# Patient Record
Sex: Male | Born: 1975
Health system: Southern US, Community
[De-identification: ages and names within clinical notes are randomized; demographics above are authoritative.]

## PROBLEM LIST (undated history)

## (undated) DIAGNOSIS — M109 Gout, unspecified: Secondary | ICD-10-CM

## (undated) DIAGNOSIS — F419 Anxiety disorder, unspecified: Principal | ICD-10-CM

## (undated) DIAGNOSIS — F329 Major depressive disorder, single episode, unspecified: Secondary | ICD-10-CM

## (undated) HISTORY — PX: THROAT SURGERY: SHX803

## (undated) HISTORY — PX: CHOLECYSTECTOMY: SHX55

## (undated) HISTORY — DX: Major depressive disorder, single episode, unspecified: F32.9

## (undated) HISTORY — DX: Gout, unspecified: M10.9

## (undated) HISTORY — DX: Anxiety disorder, unspecified: F41.9

## (undated) HISTORY — PX: APPENDECTOMY: SHX54

---

## 2009-05-13 ENCOUNTER — Ambulatory Visit: Payer: Self-pay | Admitting: Internal Medicine

## 2009-05-13 DIAGNOSIS — F329 Major depressive disorder, single episode, unspecified: Secondary | ICD-10-CM

## 2009-05-13 DIAGNOSIS — E669 Obesity, unspecified: Secondary | ICD-10-CM | POA: Insufficient documentation

## 2009-05-13 DIAGNOSIS — M109 Gout, unspecified: Secondary | ICD-10-CM | POA: Insufficient documentation

## 2009-05-13 DIAGNOSIS — F419 Anxiety disorder, unspecified: Secondary | ICD-10-CM

## 2009-05-13 DIAGNOSIS — F32A Depression, unspecified: Secondary | ICD-10-CM | POA: Insufficient documentation

## 2009-05-13 HISTORY — DX: Anxiety disorder, unspecified: F41.9

## 2009-05-13 HISTORY — DX: Gout, unspecified: M10.9

## 2009-05-13 HISTORY — DX: Depression, unspecified: F32.A

## 2009-05-16 LAB — CONVERTED CEMR LAB
ALT: 41 units/L (ref 0–53)
BUN: 11 mg/dL (ref 6–23)
CO2: 28 meq/L (ref 19–32)
Chloride: 108 meq/L (ref 96–112)
Creatinine, Ser: 1 mg/dL (ref 0.4–1.5)
Glucose, Bld: 101 mg/dL — ABNORMAL HIGH (ref 70–99)

## 2010-05-13 NOTE — Assessment & Plan Note (Signed)
Summary: NEW TO EST,GOUT,UHC INS,NS/RH......Tyler Doyle   Vital Signs:  Patient profile:   35 year old male Height:      69.5 inches Weight:      292.13 pounds BMI:     42.68 Pulse rate:   60 / minute BP sitting:   100 / 60  Vitals Entered By: Kandice Hams (May 13, 2009 1:06 PM) CC: new pt discuss gout   History of Present Illness: needs a PCP  started healthier diet and exercising more in the last 4 weeks.  Has lost several pounds  he has a long history of gout, usually have episodes every 3 months.  His last episode started before his diet,4 weeks ago, usually his pain and swelling in one of his feet.   Preventive Screening-Counseling & Management  Alcohol-Tobacco     Alcohol drinks/day: 0     Smoking Status: never  Caffeine-Diet-Exercise     Caffeine use/day: coffee 1 cup a day, soda coke  Safety-Violence-Falls     Seat Belt Use: yes     Helmet Use: no     Firearms in the Home: firearms in the home     Smoke Detectors: yes     Violence in the Home: not applicable  Current Medications (verified): 1)  Prozac 20 Mg Caps (Fluoxetine Hcl) .Tyler Doyle.. 1 By Mouth Qd 2)  Allopurinol 300 Mg Tabs (Allopurinol)  Allergies (verified): No Known Drug Allergies  Past History:  Past Medical History: Depression, f/u  at  [facility]/  [facility] gout obesity   Past Surgical History: Appendectomy 1990 vocal cords 1999 Cholecystectomy (1997)  Family History: DM-- F M  MI-- F age 75 colon ca-- no prostate ca--no  Social History: Occupation Armed forces training and education officer Married 2 children  from [place] tobacco--no ETOH-- no Smoking Status:  never Caffeine use/day:  coffee 1 cup a day, soda coke Seat Belt Use:  yes Occupation:  employed  Review of Systems       denies chest pain or road extremity edema was slightly short of breath prior to his diet, now feels much better with exercise and weight loss  Physical Exam  General:  alert, well-developed, and  overweight-appearing.   Neck:  no masses and no thyromegaly.   Lungs:  normal respiratory effort, no intercostal retractions, no accessory muscle use, and normal breath sounds.   Heart:  normal rate, regular rhythm, and no murmur.   Extremities:  no pretibial edema bilaterally  no evidence of gout today Psych:  Oriented X3, memory intact for recent and remote, normally interactive, good eye contact, not anxious appearing, and not depressed appearing.     Impression & Recommendations:  Problem # 1:  GOUT (ICD-274.9) labs he is taking allopurinol, continue with that for now Indocin as needed for pain printed material provided regards diet  I anticipate that his diet and weight loss will help his gout  tremendously, hopefully at some point we will be able to stop allopurinol His updated medication list for this problem includes:    Allopurinol 300 Mg Tabs (Allopurinol)    Indomethacin 50 Mg Caps (Indomethacin) ..... One by mouth every 6 hours as needed for gout  Orders: Venipuncture (16109) TLB-Uric Acid, Blood (84550-URIC) TLB-ALT (SGPT) (84460-ALT) TLB-AST (SGOT) (84450-SGOT) TLB-BMP (Basic Metabolic Panel-BMET) (80048-METABOL)  Problem # 2:  OBESITY (ICD-278.00) praised  for his efforts to lose weight  Complete Medication List: 1)  Prozac 20 Mg Caps (Fluoxetine hcl) .Tyler Doyle.. 1 by mouth qd 2)  Allopurinol 300 Mg  Tabs (Allopurinol) 3)  Indomethacin 50 Mg Caps (Indomethacin) .... One by mouth every 6 hours as needed for gout  Patient Instructions: 1)  Please schedule a follow-up appointment in 2 months, fasting, physical exam Prescriptions: ALLOPURINOL 300 MG TABS (ALLOPURINOL)   #30 x 3   Entered and Authorized by:   Nolon Rod. Anetria Harwick MD   Signed by:   Nolon Rod. Najir Roop MD on 05/13/2009   Method used:   Print then Give to Patient   RxID:   [id number] INDOMETHACIN 50 MG CAPS (INDOMETHACIN) one by mouth every 6 hours as needed for gout  #60 x 0   Entered and Authorized by:   Nolon Rod.  Lorren Splawn MD   Signed by:   Nolon Rod. Jonesha Tsuchiya MD on 05/13/2009   Method used:   Print then Give to Patient   RxID:   [id number]

## 2011-06-19 ENCOUNTER — Ambulatory Visit (INDEPENDENT_AMBULATORY_CARE_PROVIDER_SITE_OTHER): Payer: 59 | Admitting: Family Medicine

## 2011-06-19 ENCOUNTER — Encounter: Payer: Self-pay | Admitting: Family Medicine

## 2011-06-19 VITALS — BP 112/78 | HR 72 | Temp 99.0°F | Wt 240.0 lb

## 2011-06-19 DIAGNOSIS — R141 Gas pain: Secondary | ICD-10-CM | POA: Insufficient documentation

## 2011-06-19 DIAGNOSIS — R143 Flatulence: Secondary | ICD-10-CM

## 2011-06-19 NOTE — Patient Instructions (Signed)
Your abdominal pain is most likely gas and bloating Start Beano due to your high fiber diet Drink plenty of water Call with any questions or concerns Hang in there!!!

## 2011-06-19 NOTE — Progress Notes (Signed)
  Subjective:    Patient ID: Keelen Quevedo, male    DOB: 04-Apr-1976, 36 y.o.   MRN: [id number]  HPI abd pain- started new diet 3 weeks ago that is predominantly fruits and veggies.  Started supplement that expands in gut- Glucomannan.  Developed abdominal pressure and bloating, primarily on R side.  Stopped supplement 1 week ago and sxs have improved.  Will still have some pressure after eating.  Has not noticed whether sxs improve w/ BMs.  No N/V.  No constipation or diarrhea.  No fevers.   Review of Systems For ROS see HPI     Objective:   Physical Exam  Constitutional: He is oriented to person, place, and time. He appears well-developed and well-nourished. No distress.  HENT:  Head: Normocephalic and atraumatic.  Eyes: Conjunctivae and EOM are normal. Pupils are equal, round, and reactive to light.  Neck: Normal range of motion. Neck supple. No thyromegaly present.  Cardiovascular: Normal rate, regular rhythm, normal heart sounds and intact distal pulses.   No murmur heard. Pulmonary/Chest: Effort normal and breath sounds normal. No respiratory distress.  Abdominal: Soft. Bowel sounds are normal. He exhibits no distension and no mass. There is no tenderness. There is no rebound and no guarding.  Musculoskeletal: He exhibits no edema.  Lymphadenopathy:    He has no cervical adenopathy.  Neurological: He is alert and oriented to person, place, and time. No cranial nerve deficit.  Skin: Skin is warm and dry.  Psychiatric: He has a normal mood and affect. His behavior is normal.          Assessment & Plan:

## 2011-06-21 NOTE — Assessment & Plan Note (Signed)
New.  Pt's pain is most likely due to gas caused by new supplement and high fiber diet.  No abnormalities on PE.  Reviewed supportive care and red flags that should prompt return.  Pt expressed understanding and is in agreement w/ plan.

## 2014-02-23 ENCOUNTER — Ambulatory Visit (INDEPENDENT_AMBULATORY_CARE_PROVIDER_SITE_OTHER): Payer: 59 | Admitting: Internal Medicine

## 2014-02-23 ENCOUNTER — Encounter: Payer: Self-pay | Admitting: Internal Medicine

## 2014-02-23 VITALS — BP 119/77 | HR 80 | Temp 98.3°F | Wt 260.0 lb

## 2014-02-23 DIAGNOSIS — S8392XA Sprain of unspecified site of left knee, initial encounter: Secondary | ICD-10-CM

## 2014-02-23 NOTE — Progress Notes (Signed)
Pre visit review using our clinic review tool, if applicable. No additional management support is needed unless otherwise documented below in the visit note. 

## 2014-02-23 NOTE — Progress Notes (Signed)
   Subjective:    Patient ID: Tyler Doyle, male    DOB: 1976-03-14, 38 y.o.   MRN: [id number]  DOS:  02/23/2014 Type of visit - description : acute Interval history: 4-5 days ago exercised, first time he did  in a while. The next day he started with pain at the left knee. No swelling. The pain is on and off, only moderate today. Has not taken any medication for it.   ROS Denies injury or fall  the knee doesn't give out or locks.  No fever chills  History reviewed. No pertinent past medical history.  Past Surgical History  Procedure Laterality Date  . Appendectomy    . Cholecystectomy      History   Social History  . Marital Status: Married    Spouse Name: N/A    Number of Children: 2  . Years of Education: N/A   Occupational History  . musician    Social History Main Topics  . Smoking status: Never Smoker   . Smokeless tobacco: Never Used  . Alcohol Use: No  . Drug Use: No  . Sexual Activity: Not on file   Other Topics Concern  . Not on file   Social History Narrative   Separated from wife        Medication List    Notice  As of 02/23/2014 11:59 PM   You have not been prescribed any medications.         Objective:   Physical Exam  Musculoskeletal:       Legs:  BP 119/77 mmHg  Pulse 80  Temp(Src) 98.3 F (36.8 C) (Oral)  Wt 260 lb (117.935 kg)  SpO2 97% General -- alert, well-developed, NAD.    Extremities-- no pretibial edema bilaterally  R knee  normal Left knee:  no red, swollen or warm. Slightly TTP  Externally, see graphic. Range of motion is normal. Joint is  stable. Neurologic--  alert & oriented X3. Speech normal, gait appropriate for age, strength symmetric and appropriate for age.  Psych-- Cognition and judgment appear intact. Cooperative with normal attention span and concentration. No anxious or depressed appearing.        Assessment & Plan:    Knee sprain, Recommend conservative treatment, see instructions. If not  better consider sports medicine referral

## 2014-02-23 NOTE — Patient Instructions (Signed)
Apply ice to the knee 2 times a day for the next week  IBUPROFEN (Advil or Motrin) 200 mg 2 tablets every 6 hours as needed for pain.  Always take it with food because may cause gastritis and ulcers.  If you notice nausea, stomach pain, change in the color of stools --->  Stop the medicine and let us know  Use a knee sleeve as needed  Go back to exercise once  better, remember to start a gradual exercise program, always a stretching before exercise  Call if not improving in the next 2 weeks

## 2014-04-24 ENCOUNTER — Emergency Department (HOSPITAL_COMMUNITY): Payer: 59

## 2014-04-24 ENCOUNTER — Emergency Department (HOSPITAL_COMMUNITY)
Admission: EM | Admit: 2014-04-24 | Discharge: 2014-04-24 | Disposition: A | Discharge status: Home / Self Care | Payer: 59 | Attending: Emergency Medicine | Admitting: Emergency Medicine

## 2014-04-24 ENCOUNTER — Encounter (HOSPITAL_COMMUNITY): Payer: Self-pay

## 2014-04-24 ENCOUNTER — Other Ambulatory Visit: Payer: Self-pay

## 2014-04-24 ENCOUNTER — Ambulatory Visit: Payer: Self-pay | Admitting: Medical

## 2014-04-24 ENCOUNTER — Telehealth: Payer: Self-pay | Admitting: Internal Medicine

## 2014-04-24 DIAGNOSIS — R002 Palpitations: Secondary | ICD-10-CM

## 2014-04-24 DIAGNOSIS — R0602 Shortness of breath: Secondary | ICD-10-CM | POA: Insufficient documentation

## 2014-04-24 DIAGNOSIS — F419 Anxiety disorder, unspecified: Secondary | ICD-10-CM

## 2014-04-24 LAB — BASIC METABOLIC PANEL
ANION GAP: 11 (ref 5–15)
BUN: 16 mg/dL (ref 6–23)
CO2: 21 mmol/L (ref 19–32)
Calcium: 10.1 mg/dL (ref 8.4–10.5)
Chloride: 109 mEq/L (ref 96–112)
Creatinine, Ser: 0.96 mg/dL (ref 0.50–1.35)
GFR calc Af Amer: >90 mL/min (ref >90)
Glucose, Bld: 93 mg/dL (ref 70–99)
Potassium: 3.9 mmol/L (ref 3.5–5.1)
SODIUM: 141 mmol/L (ref 135–145)

## 2014-04-24 LAB — CBC
HCT: 41.7 % (ref 39.0–52.0)
Hemoglobin: 14.6 g/dL (ref 13.0–17.0)
MCH: 31.3 pg (ref 26.0–34.0)
MCHC: 35 g/dL (ref 30.0–36.0)
MCV: 89.5 fL (ref 78.0–100.0)
PLATELETS: 159 10*3/uL (ref 150–400)
RBC: 4.66 MIL/uL (ref 4.22–5.81)
RDW: 13.3 % (ref 11.5–15.5)
WBC: 5 10*3/uL (ref 4.0–10.5)

## 2014-04-24 LAB — I-STAT TROPONIN, ED: Troponin i, poc: 0 ng/mL (ref 0.00–0.08)

## 2014-04-24 MED ORDER — FLUOXETINE HCL 20 MG PO TABS: 20.0000 mg | ORAL_TABLET | Freq: Every day | ORAL | Status: DC

## 2014-04-24 NOTE — ED Notes (Signed)
Pt states he has been having sob and heart palpitations for the past 4 nights and when he lays down at night the palpitations get worse. Has been taking ibuprofen. No nausea or vomiting.

## 2014-04-24 NOTE — Telephone Encounter (Signed)
Pt called back [facility] RN advised pt to go to Urgent Care

## 2014-04-24 NOTE — Telephone Encounter (Signed)
Noted  

## 2014-04-24 NOTE — Telephone Encounter (Signed)
Pt was transfer to [facility] due to shortness of breath, rapid heartbeat. Scheduled pt to see PA today at 9:15am.

## 2014-04-24 NOTE — ED Provider Notes (Signed)
CSN: [id number]     Arrival date & time 04/24/14  4098 History   First MD Initiated Contact with Patient 04/24/14 0945     Chief Complaint  Patient presents with  . Shortness of Breath  . Palpitations     (Consider location/radiation/quality/duration/timing/severity/associated sxs/prior Treatment) Patient is a 39 y.o. male presenting with shortness of breath. The history is provided by the patient.  Shortness of Breath Severity:  Moderate Onset quality:  Sudden Duration:  4 days Timing:  Intermittent Progression:  Waxing and waning Context comment:  Only occurs at night when going to bed Relieved by:  Nothing Exacerbated by: night time. Ineffective treatments:  None tried Associated symptoms: no abdominal pain, no chest pain, no cough, no diaphoresis, no fever, no headaches, no rash, no sore throat, no vomiting and no wheezing   Risk factors: no family hx of DVT, no hx of PE/DVT, no prolonged immobilization and no recent surgery     History reviewed. No pertinent past medical history. Past Surgical History  Procedure Laterality Date  . Appendectomy    . Cholecystectomy     No family history on file. History  Substance Use Topics  . Smoking status: Never Smoker   . Smokeless tobacco: Never Used  . Alcohol Use: No    Review of Systems  Constitutional: Negative for fever, diaphoresis, activity change and appetite change.  HENT: Negative for facial swelling, sore throat, tinnitus, trouble swallowing and voice change.   Eyes: Negative for pain, redness and visual disturbance.  Respiratory: Positive for shortness of breath. Negative for cough, chest tightness and wheezing.   Cardiovascular: Positive for palpitations. Negative for chest pain and leg swelling.  Gastrointestinal: Negative for nausea, vomiting, abdominal pain, diarrhea, constipation and abdominal distention.  Endocrine: Negative.   Genitourinary: Negative.  Negative for dysuria, decreased urine volume, scrotal  swelling and testicular pain.  Musculoskeletal: Negative for myalgias, back pain and gait problem.  Skin: Negative.  Negative for rash.  Neurological: Negative.  Negative for dizziness, tremors, weakness and headaches.  Psychiatric/Behavioral: Negative for suicidal ideas, hallucinations and self-injury. The patient is nervous/anxious.       Allergies  Sulfa antibiotics  Home Medications   Prior to Admission medications   Medication Sig Start Date End Date Taking? Authorizing Provider  FLUoxetine (PROZAC) 20 MG tablet Take 1 tablet (20 mg total) by mouth daily. 04/24/14   Lula Olszewski, MD   BP 123/70 mmHg  Pulse 66  Temp(Src) 98.1 F (36.7 C) (Oral)  Resp 14  Ht  (1.778 m)  Wt 270 lb (122.471 kg)  BMI 38.74 kg/m2  SpO2 97% Physical Exam  Constitutional: He is oriented to person, place, and time. He appears well-developed and well-nourished. No distress.  HENT:  Head: Normocephalic and atraumatic.  Right Ear: External ear normal.  Left Ear: External ear normal.  Nose: Nose normal.  Mouth/Throat: Uvula is midline and oropharynx is clear and moist. No trismus in the jaw. No uvula swelling. No oropharyngeal exudate, posterior oropharyngeal edema, posterior oropharyngeal erythema or tonsillar abscesses.  No fluctuance of floor of mouth or deep space abscesses  Eyes: Conjunctivae and EOM are normal. Pupils are equal, round, and reactive to light. No scleral icterus.  Neck: Normal range of motion. Neck supple. No JVD present. No tracheal deviation present. No thyromegaly present.  Cardiovascular: Normal rate and intact distal pulses.  Exam reveals no gallop and no friction rub.   No murmur heard. Pulmonary/Chest: Effort normal and breath sounds normal. No stridor.  No respiratory distress. He has no wheezes. He has no rales.  Abdominal: Soft. He exhibits no distension. There is no tenderness. There is no rebound and no guarding.  Musculoskeletal: Normal range of motion. He  exhibits no edema or tenderness.  Neurological: He is alert and oriented to person, place, and time. No cranial nerve deficit. He exhibits normal muscle tone. Coordination normal.  5/5 strength in all 4 extremities. Normal Gait.   Skin: Skin is warm and dry. No rash noted. He is not diaphoretic.  Psychiatric: He has a normal mood and affect. His behavior is normal. He expresses no homicidal and no suicidal ideation. He expresses no suicidal plans and no homicidal plans.  Nursing note and vitals reviewed.   ED Course  Procedures (including critical care time) Labs Review Labs Reviewed  CBC  BASIC METABOLIC PANEL  Rosezena SensorI-STAT TROPOININ, ED    Imaging Review Dg Chest 2 View  04/24/2014   CLINICAL DATA:  Shortness of breath, heart palpitations for the past 4 nights.  EXAM: CHEST  2 VIEW  COMPARISON:  None.  FINDINGS: The heart size and mediastinal contours are within normal limits. Both lungs are clear. The visualized skeletal structures are unremarkable.  IMPRESSION: No active cardiopulmonary disease.   Electronically Signed   By: Charlett NoseKevin  Dover M.D.   On: 04/24/2014 10:31     EKG Interpretation   Date/Time:  Tuesday April 24 2014 09:37:45 EST Ventricular Rate:  77 PR Interval:  142 QRS Duration: 90 QT Interval:  372 QTC Calculation: 420 R Axis:   50 Text Interpretation:  Normal sinus rhythm Normal ECG No old tracing to  compare Confirmed by MILLER  MD, BRIAN ([id number]) on 04/24/2014 9:47:10 AM      MDM   Final diagnoses:  Palpitations  Anxiety    The patient is a 39 year old male history of anxiety and depression presents with 4 days of intermittent shortness of breath and palpitations that only occur at nighttime. Patient reports that he is asymptomatic throughout the day. Patient does endorse anxiety with the symptoms. Of note patient recently stopped taking Prozac 6 months ago due to sexual side effects. EKG shows sinus rhythm with no ischemic changes. Laboratory tests including  troponin are unremarkable. Chest x-ray shows no acute cardiopulmonary pathology. I do not clinically suspect PE due to intermittent nature of symptoms and patient is PERC negative so will not pursue further testing. Do not suspect ACS, athymia, or other acute pathology due to patient's history exam and workup. I have discussed the risks and benefits of restarting Prozac at this time and the patient states that he would wish to restart the medication knowing that he may experience side effects. I feel patient is appropriate for discharge with primary care follow-up. Patient voices understanding and agreement with this plan and is discharged with standard emergency department return precautions.  Patient seen with attending, Dr. Hyacinth MeekerMiller, who oversaw clinical decision making.   Lula OlszewskiMike Ketara Cavness, MD 04/24/14 1120  Vida RollerBrian D Miller, MD 04/24/14 1126

## 2014-04-26 ENCOUNTER — Encounter: Payer: Self-pay | Admitting: Internal Medicine

## 2014-04-26 ENCOUNTER — Ambulatory Visit (INDEPENDENT_AMBULATORY_CARE_PROVIDER_SITE_OTHER): Payer: 59 | Admitting: Internal Medicine

## 2014-04-26 VITALS — BP 132/78 | HR 85 | Temp 98.3°F | Wt 267.5 lb

## 2014-04-26 DIAGNOSIS — F419 Anxiety disorder, unspecified: Principal | ICD-10-CM

## 2014-04-26 DIAGNOSIS — F329 Major depressive disorder, single episode, unspecified: Secondary | ICD-10-CM

## 2014-04-26 DIAGNOSIS — F32A Depression, unspecified: Secondary | ICD-10-CM

## 2014-04-26 DIAGNOSIS — N529 Male erectile dysfunction, unspecified: Secondary | ICD-10-CM | POA: Insufficient documentation

## 2014-04-26 DIAGNOSIS — N522 Drug-induced erectile dysfunction: Secondary | ICD-10-CM

## 2014-04-26 DIAGNOSIS — F418 Other specified anxiety disorders: Secondary | ICD-10-CM

## 2014-04-26 DIAGNOSIS — R002 Palpitations: Secondary | ICD-10-CM

## 2014-04-26 LAB — TSH: TSH: 2.2 u[IU]/mL (ref 0.35–4.50)

## 2014-04-26 MED ORDER — SILDENAFIL CITRATE 100 MG PO TABS: 50.0000 mg | ORAL_TABLET | Freq: Every day | ORAL | Status: DC | PRN

## 2014-04-26 NOTE — Patient Instructions (Signed)
Get your blood work before you leave     Please come back to the office in 6 months  for a physical exam. Come back fasting   

## 2014-04-26 NOTE — Assessment & Plan Note (Signed)
Seems to be a side effect from Prozac, see comments under anxiety. Prescribed Viagra, how to use it discussed

## 2014-04-26 NOTE — Progress Notes (Signed)
Pre visit review using our clinic review tool, if applicable. No additional management support is needed unless otherwise documented below in the visit note. 

## 2014-04-26 NOTE — Progress Notes (Signed)
   Subjective:    Patient ID: Tyler Doyle, male    DOB: 02-Jul-1975, 39 y.o.   MRN: [id number]  DOS:  04/26/2014 Type of visit - description : ER follow-up Interval history: Tyler FlesherWent to the ER 04/24/2014 after he developed shortness or breath when lying down, palpitations described as his heart going fast. Symptoms have been in the context of the patient quitting SSRI 6 months ago. At the ER EKG, CBC, BMP and chest x-ray where normal or negative. The patient felt symptoms were related to anxiety because he was quite anxious at the time of the palpitations. He was released home and recommended to restart fluoxetine which he did. He is asymptomatic since. As far as the anxiety, he has been on SSRIs for years, see assessment and plan.    ROS Has been taking ibuprofen consistently for the last few weeks due to dental pain. Denies nausea, vomiting, blood in the stools or abdominal pain. Occasionally feels bloated. He has some heartburn.  Past Medical History  Diagnosis Date  . Anxiety and depression 05/13/2009    Qualifier: Diagnosis of  By: Drue NovelPaz MD, Tyler RodJose E.   . Gout 05/13/2009    Qualifier: Diagnosis of  By: Drue NovelPaz MD, Tyler RodJose E.     Past Surgical History  Procedure Laterality Date  . Appendectomy    . Cholecystectomy    . Throat surgery      vocal cord polyp    History   Social History  . Marital Status: Married    Spouse Name: N/A    Number of Children: 2  . Years of Education: N/A   Occupational History  . musician    Social History Main Topics  . Smoking status: Never Smoker   . Smokeless tobacco: Never Used  . Alcohol Use: No  . Drug Use: No  . Sexual Activity: Not on file   Other Topics Concern  . Not on file   Social History Narrative   Separated from wife   [place]        Medication List       This list is accurate as of: 04/26/14 11:59 PM.  Always use your most recent med list.               FLUoxetine 20 MG tablet  Commonly known as:   PROZAC  Take 1 tablet (20 mg total) by mouth daily.     sildenafil 100 MG tablet  Commonly known as:  VIAGRA  Take 0.5-1 tablets (50-100 mg total) by mouth daily as needed for erectile dysfunction.           Objective:   Physical Exam BP 132/78 mmHg  Pulse 85  Temp(Src) 98.3 F (36.8 C) (Oral)  Wt 267 lb 8 oz (121.337 kg)  SpO2 93%  General -- alert, well-developed, NAD.  Neck --no thyromegaly   HEENT-- Not pale.   Lungs -- normal respiratory effort, no intercostal retractions, no accessory muscle use, and normal breath sounds.  Heart-- normal rate, regular rhythm, no murmur.   Extremities-- no pretibial edema bilaterally  Neurologic--  alert & oriented X3. Speech normal, gait appropriate for age, strength symmetric and appropriate for age.   Psych-- Cognition and judgment appear intact. Cooperative with normal attention span and concentration. No anxious or depressed appearing.       Assessment & Plan:   palpitations, believed to be related to anxiety, for completeness we'll check a TSH, patient to call if symptoms resurface

## 2014-04-26 NOTE — Assessment & Plan Note (Addendum)
Long history of anxiety and depression, sees Dr. Tomasa Randunningham, on Prozac for 3 or 4 years; due to  erectile dysfunction ( a s/e from meds) he decided to discontinue Prozac. Become very anxious couple days ago and ended up in the ER. Recommend to see  psychiatry and see if there is any alternative for fluoxetine. We also talked about erectile dysfunction, I'm not opposed to try Viagra to mitigate the side effects, the patient stated that he has used successfully in the past. Prescription for viagra provided, cont SSRIs

## 2015-01-02 ENCOUNTER — Telehealth: Payer: Self-pay

## 2015-01-02 NOTE — Telephone Encounter (Signed)
Attempted PVC but mailbox was full

## 2015-01-03 ENCOUNTER — Ambulatory Visit (INDEPENDENT_AMBULATORY_CARE_PROVIDER_SITE_OTHER): Payer: 59 | Admitting: Internal Medicine

## 2015-01-03 ENCOUNTER — Encounter: Payer: Self-pay | Admitting: Internal Medicine

## 2015-01-03 VITALS — BP 116/70 | HR 61 | Temp 97.5°F | Ht 70.0 in | Wt 270.1 lb

## 2015-01-03 DIAGNOSIS — Z0000 Encounter for general adult medical examination without abnormal findings: Secondary | ICD-10-CM

## 2015-01-03 DIAGNOSIS — Z09 Encounter for follow-up examination after completed treatment for conditions other than malignant neoplasm: Secondary | ICD-10-CM | POA: Insufficient documentation

## 2015-01-03 DIAGNOSIS — Z23 Encounter for immunization: Secondary | ICD-10-CM

## 2015-01-03 DIAGNOSIS — R7989 Other specified abnormal findings of blood chemistry: Secondary | ICD-10-CM

## 2015-01-03 DIAGNOSIS — Z114 Encounter for screening for human immunodeficiency virus [HIV]: Secondary | ICD-10-CM

## 2015-01-03 LAB — LIPID PANEL
CHOL/HDL RATIO: 4
CHOLESTEROL: 159 mg/dL (ref 0–200)
HDL: 36.4 mg/dL — AB (ref >39.00)
NonHDL: 122.16
TRIGLYCERIDES: 246 mg/dL — AB (ref 0.0–149.0)
VLDL: 49.2 mg/dL — AB (ref 0.0–40.0)

## 2015-01-03 LAB — AST: AST: 31 U/L (ref 0–37)

## 2015-01-03 LAB — URIC ACID: Uric Acid, Serum: 7.9 mg/dL — ABNORMAL HIGH (ref 4.0–7.8)

## 2015-01-03 LAB — LDL CHOLESTEROL, DIRECT: Direct LDL: 113 mg/dL

## 2015-01-03 LAB — ALT: ALT: 34 U/L (ref 0–53)

## 2015-01-03 MED ORDER — ZOLPIDEM TARTRATE 10 MG PO TABS: 10.0000 mg | ORAL_TABLET | Freq: Every evening | ORAL | Status: DC | PRN

## 2015-01-03 MED ORDER — SILDENAFIL CITRATE 25 MG PO TABS: 50.0000 mg | ORAL_TABLET | Freq: Every day | ORAL | Status: DC | PRN

## 2015-01-03 MED ORDER — ZALEPLON 10 MG PO CAPS: 10.0000 mg | ORAL_CAPSULE | Freq: Every evening | ORAL | Status: DC | PRN

## 2015-01-03 NOTE — Progress Notes (Signed)
Pre visit review using our clinic review tool, if applicable. No additional management support is needed unless otherwise documented below in the visit note. 

## 2015-01-03 NOTE — Progress Notes (Signed)
Subjective:    Patient ID: Tyler Doyle, male    DOB: [birth year], 39 y.o.   MRN: [id number]  DOS:  01/03/2015 Type of visit - description : CPX Interval history: No major concerns    Review of Systems  Constitutional: No fever. No chills. No unexplained wt changes. No unusual sweats  HEENT: Developed a cold when he came back from visiting [place]  2 weeks ago, had cough, sinus congestion, doing better, still has  postnasal dripping. Respiratory: No wheezing , no  difficulty breathing. No cough , no mucus production  Cardiovascular: No CP, no leg swelling , no  Palpitations  GI: no nausea, no vomiting, no diarrhea , no  abdominal pain.  No blood in the stools. No dysphagia, no odynophagia    Endocrine: No polyphagia, no polyuria , no polydipsia  GU: No dysuria, gross hematuria, difficulty urinating. No urinary urgency, no frequency.  Musculoskeletal: No joint swellings or unusual aches or pains  Skin: No change in the color of the skin, palor , no  Rash  Allergic, immunologic: No environmental allergies , no  food allergies  Neurological: No dizziness no  syncope. No headaches. No diplopia, no slurred, no slurred speech, no motor deficits, no facial  Numbness  Hematological: No enlarged lymph nodes, no easy bruising , no unusual bleedings  Psychiatry: No suicidal ideas, no hallucinations, no beavior problems, no confusion.  No unusual/severe anxiety, no depression  Past Medical History  Diagnosis Date  . Anxiety and depression 05/13/2009    Qualifier: Diagnosis of  By: Drue Novel MD, Nolon Rod.   . Gout 05/13/2009    Qualifier: Diagnosis of  By: Drue Novel MD, Nolon Rod.     Past Surgical History  Procedure Laterality Date  . Appendectomy    . Cholecystectomy    . Throat surgery      vocal cord polyp    Social History   Social History  . Marital Status: Married    Spouse Name: N/A  . Number of Children: 2  . Years of Education: N/A   Occupational History  . musician     Social History Main Topics  . Smoking status: Never Smoker   . Smokeless tobacco: Never Used  . Alcohol Use: 0.0 oz/week    0 Standard drinks or equivalent per week  . Drug Use: No  . Sexual Activity: Not on file   Other Topics Concern  . Not on file   Social History Narrative   Separated from wife   Original from [place]     Family History  Problem Relation Age of Onset  . Colon cancer Neg Hx   . Prostate cancer Neg Hx   . CAD Father 64  . Diabetes Father 66       Medication List       This list is accurate as of: 01/03/15  8:18 PM.  Always use your most recent med list.               FLUoxetine 20 MG tablet  Commonly known as:  PROZAC  Take 1 tablet (20 mg total) by mouth daily.     sildenafil 25 MG tablet  Commonly known as:  VIAGRA  Take 2-3 tablets (50-75 mg total) by mouth daily as needed for erectile dysfunction.     zaleplon 10 MG capsule  Commonly known as:  SONATA  Take 1 capsule (10 mg total) by mouth at bedtime as needed for sleep.  Objective:   Physical Exam BP 116/70 mmHg  Pulse 61  Temp(Src) 97.5 F (36.4 C) (Oral)  Ht  (1.778 m)  Wt 270 lb 2 oz (122.528 kg)  BMI 38.76 kg/m2  SpO2 95% General:   Well developed, well nourished . NAD.  HEENT:  Normocephalic . Face symmetric, atraumatic. Nose is slightly congested Neck: No thyromegaly Lungs:  CTA B Normal respiratory effort, no intercostal retractions, no accessory muscle use. Heart: RRR,  no murmur.  no pretibial edema bilaterally  Abdomen:  Not distended, soft, non-tender. No rebound or rigidity. No mass,organomegaly Skin: Not pale. Not jaundice Neurologic:  alert & oriented X3.  Speech normal, gait appropriate for age and unassisted Psych--  Cognition and judgment appear intact.  Cooperative with normal attention span and concentration.  Behavior appropriate. No anxious or depressed appearing.    Assessment & Plan:   Assessment > Anxiety,  depression, insomnia --used to see Dr. Tomasa Rand, started to get prescriptions here 12-2014, on prozac/sonata prn ED d/t  Prozac on Viagra prn Gout  Plan  Anxiety depression: on Prozac and sonata as needed, used to see psychiatry, MD retired. Usually takes meds when there is a "crisis" like when his parents got sick. That strategy is working for the patient, continue with the same, if he self -start SSRIs recommend to take it at least for several weeks.  ED: Refill Viagra URI: Resolving, recommend Mucinex DM, Flonase until better

## 2015-01-03 NOTE — Patient Instructions (Addendum)
Get your blood work before you leave     Next visit  for a routine checkup in 6-8 months, no need to be fasting Please schedule an appointment at the front desk   Take Mucinex DM and Flonase (2 sprays in each side of the nose every morning) until better.

## 2015-01-03 NOTE — Assessment & Plan Note (Signed)
Anxiety depression: on Prozac and sonata as needed, used to see psychiatry, MD retired. Usually takes meds when there is a "crisis" like when his parents got sick. That strategy is working for the patient, continue with the same, if he self -start SSRIs recommend to take it at least for several weeks.  ED: Refill Viagra URI: Resolving, recommend Mucinex DM, Flonase until better

## 2015-01-03 NOTE — Assessment & Plan Note (Signed)
Tdap today No FH of colon or prostate cancer Labs: LFTs, FLP, uric acid, HIV Diet and exercise discussed

## 2015-01-04 LAB — HIV ANTIBODY (ROUTINE TESTING W REFLEX): HIV 1&2 Ab, 4th Generation: NONREACTIVE

## 2015-04-04 ENCOUNTER — Encounter: Payer: Self-pay | Admitting: Internal Medicine

## 2015-04-04 ENCOUNTER — Ambulatory Visit (INDEPENDENT_AMBULATORY_CARE_PROVIDER_SITE_OTHER): Payer: 59 | Admitting: Internal Medicine

## 2015-04-04 VITALS — BP 108/80 | HR 90 | Temp 98.8°F | Ht 69.0 in | Wt 270.0 lb

## 2015-04-04 DIAGNOSIS — M10072 Idiopathic gout, left ankle and foot: Secondary | ICD-10-CM | POA: Diagnosis not present

## 2015-04-04 DIAGNOSIS — M109 Gout, unspecified: Secondary | ICD-10-CM

## 2015-04-04 DIAGNOSIS — Z09 Encounter for follow-up examination after completed treatment for conditions other than malignant neoplasm: Secondary | ICD-10-CM

## 2015-04-04 LAB — CBC WITH DIFFERENTIAL/PLATELET
BASOS ABS: 0 10*3/uL (ref 0.0–0.1)
Basophils Relative: 0.1 % (ref 0.0–3.0)
EOS ABS: 0.1 10*3/uL (ref 0.0–0.7)
Eosinophils Relative: 0.8 % (ref 0.0–5.0)
HCT: 40 % (ref 39.0–52.0)
HEMOGLOBIN: 13.5 g/dL (ref 13.0–17.0)
LYMPHS PCT: 10 % — AB (ref 12.0–46.0)
Lymphs Abs: 0.8 10*3/uL (ref 0.7–4.0)
MCHC: 33.7 g/dL (ref 30.0–36.0)
MCV: 91.3 fl (ref 78.0–100.0)
Monocytes Absolute: 0.7 10*3/uL (ref 0.1–1.0)
Monocytes Relative: 8.8 % (ref 3.0–12.0)
Neutro Abs: 6.4 10*3/uL (ref 1.4–7.7)
Neutrophils Relative %: 80.3 % — ABNORMAL HIGH (ref 43.0–77.0)
PLATELETS: 239 10*3/uL (ref 150.0–400.0)
RBC: 4.39 Mil/uL (ref 4.22–5.81)
RDW: 13.6 % (ref 11.5–15.5)
WBC: 8 10*3/uL (ref 4.0–10.5)

## 2015-04-04 LAB — COMPREHENSIVE METABOLIC PANEL
ALK PHOS: 80 U/L (ref 39–117)
ALT: 24 U/L (ref 0–53)
AST: 27 U/L (ref 0–37)
Albumin: 4.1 g/dL (ref 3.5–5.2)
BILIRUBIN TOTAL: 1.2 mg/dL (ref 0.2–1.2)
BUN: 10 mg/dL (ref 6–23)
CALCIUM: 9.4 mg/dL (ref 8.4–10.5)
CO2: 28 mEq/L (ref 19–32)
Chloride: 105 mEq/L (ref 96–112)
Creatinine, Ser: 0.9 mg/dL (ref 0.40–1.50)
GFR: 98.67 mL/min (ref >60.00)
Glucose, Bld: 88 mg/dL (ref 70–99)
Potassium: 3.6 mEq/L (ref 3.5–5.1)
Sodium: 140 mEq/L (ref 135–145)
TOTAL PROTEIN: 7.5 g/dL (ref 6.0–8.3)

## 2015-04-04 LAB — URIC ACID: URIC ACID, SERUM: 5.8 mg/dL (ref 4.0–7.8)

## 2015-04-04 MED ORDER — INDOMETHACIN ER 75 MG PO CPCR: 75.0000 mg | ORAL_CAPSULE | Freq: Every day | ORAL | Status: DC | PRN

## 2015-04-04 MED ORDER — PREDNISONE 10 MG PO TABS: ORAL_TABLET | ORAL | Status: DC

## 2015-04-04 MED ORDER — COLCHICINE 0.6 MG PO TABS: 0.6000 mg | ORAL_TABLET | Freq: Two times a day (BID) | ORAL | Status: DC | PRN

## 2015-04-04 MED ORDER — KETOCONAZOLE 2 % EX CREA: 1.0000 "application " | TOPICAL_CREAM | Freq: Every day | CUTANEOUS | Status: DC

## 2015-04-04 NOTE — Patient Instructions (Addendum)
BEFORE YOU LEAVE THE OFFICE:  GO TO THE LAB  Get the blood work    AFTER YOU LEAVE THE OFFICE:  Start prednisone For pain use either indomethacin as needed or colchicine ICE Leg elevation  If not gradually improving, getting worse  or not  back to normal in 10 days: call the office

## 2015-04-04 NOTE — Progress Notes (Signed)
Pre visit review using our clinic review tool, if applicable. No additional management support is needed unless otherwise documented below in the visit note. 

## 2015-04-04 NOTE — Progress Notes (Signed)
Subjective:    Patient ID: Tyler Doyle, male    DOB: 01-02-1976, 39 y.o.   MRN: [id number]  DOS:  04/04/2015 Type of visit - description : Acute visit Interval history: Symptoms started 2 days ago with pain at the left foot, he started limping, last night symptoms definitely got worse. Swelling similar to previous gout episodes No injury. He had a URI last week and he ate somewhat different but nothing that he thinks would   exacerbate his gout.    Review of Systems No fever chills No rash No nausea, vomiting, diarrhea.  Past Medical History  Diagnosis Date  . Anxiety and depression 05/13/2009    Qualifier: Diagnosis of  By: Drue Novel MD, Nolon Rod.   . Gout 05/13/2009    Qualifier: Diagnosis of  By: Drue Novel MD, Nolon Rod.     Past Surgical History  Procedure Laterality Date  . Appendectomy    . Cholecystectomy    . Throat surgery      vocal cord polyp    Social History   Social History  . Marital Status: Married    Spouse Name: N/A  . Number of Children: 2  . Years of Education: N/A   Occupational History  . musician    Social History Main Topics  . Smoking status: Never Smoker   . Smokeless tobacco: Never Used  . Alcohol Use: 0.0 oz/week    0 Standard drinks or equivalent per week  . Drug Use: No  . Sexual Activity: Not on file   Other Topics Concern  . Not on file   Social History Narrative   Separated from wife   Original from [place]        Medication List       This list is accurate as of: 04/04/15 11:59 PM.  Always use your most recent med list.               colchicine 0.6 MG tablet  Take 1 tablet (0.6 mg total) by mouth 2 (two) times daily as needed.     FLUoxetine 20 MG tablet  Commonly known as:  PROZAC  Take 1 tablet (20 mg total) by mouth daily.     indomethacin 75 MG CR capsule  Commonly known as:  INDOCIN SR  Take 1 capsule (75 mg total) by mouth daily as needed.     ketoconazole 2 % cream  Commonly known as:  NIZORAL  Apply 1  application topically daily.     predniSONE 10 MG tablet  Commonly known as:  DELTASONE  5 tablets x 2 days,  4 tablets x 2 days, 3 tabs x 2 days, 2 tabs x 2 days, 1 tab x 2 days     sildenafil 25 MG tablet  Commonly known as:  VIAGRA  Take 2-3 tablets (50-75 mg total) by mouth daily as needed for erectile dysfunction.     zaleplon 10 MG capsule  Commonly known as:  SONATA  Take 1 capsule (10 mg total) by mouth at bedtime as needed for sleep.           Objective:   Physical Exam BP 108/80 mmHg  Pulse 90  Temp(Src) 98.8 F (37.1 C) (Oral)  Ht  (1.753 m)  Wt 270 lb (122.471 kg)  BMI 39.85 kg/m2  SpO2 95% General:   Well developed, well nourished . NAD.  HEENT:  Normocephalic . Face symmetric, atraumatic MSK: Right foot normal Left foot : warm, slt red and swollen  mostly around the great, second and third toe and also by the external malleolus. Very TTP at the base of the great toe. Skin: Maceration between the toes bilaterally  Neurologic:  alert & oriented X3.  Speech normal, gait appropriate for age and unassisted Psych--  Cognition and judgment appear intact.  Cooperative with normal attention span and concentration.  Behavior appropriate. No anxious or depressed appearing.      Assessment & Plan:    Assessment > Anxiety, depression, insomnia --used to see Dr. Tomasa Randunningham, started to get prescriptions here 12-2014, on prozac/sonata prn ED d/t  Prozac on Viagra prn Gout: dx in the 90s, usually one episode a year   Plan: Gout: Findings consistent with gout, DDX includes cellulitis but at this point the gout  is most likely gout. Treat with prednisone, Indocin and colchicine. Allopurinol if he has recurrent attacks. Diet discussed Check a CMP CBC and uric acid Call if not improving Skin dermatitis: Likely fungal, rx Nizoral

## 2015-04-05 NOTE — Assessment & Plan Note (Signed)
Gout: Findings consistent with gout, DDX includes cellulitis but at this point the gout  is most likely gout. Treat with prednisone, Indocin and colchicine. Allopurinol if he has recurrent attacks. Diet discussed Check a CMP CBC and uric acid Call if not improving Skin dermatitis: Likely fungal, rx Nizoral

## 2015-07-31 ENCOUNTER — Ambulatory Visit (INDEPENDENT_AMBULATORY_CARE_PROVIDER_SITE_OTHER): Payer: BLUE CROSS/BLUE SHIELD | Admitting: Family Medicine

## 2015-07-31 ENCOUNTER — Encounter: Payer: Self-pay | Admitting: Family Medicine

## 2015-07-31 ENCOUNTER — Encounter: Payer: Self-pay | Admitting: Internal Medicine

## 2015-07-31 ENCOUNTER — Ambulatory Visit (INDEPENDENT_AMBULATORY_CARE_PROVIDER_SITE_OTHER): Payer: BLUE CROSS/BLUE SHIELD | Admitting: Internal Medicine

## 2015-07-31 VITALS — BP 118/79 | HR 91 | Ht 69.0 in | Wt 290.0 lb

## 2015-07-31 VITALS — BP 126/82 | HR 77 | Temp 97.7°F | Ht 69.0 in | Wt 291.0 lb

## 2015-07-31 DIAGNOSIS — M25562 Pain in left knee: Secondary | ICD-10-CM

## 2015-07-31 DIAGNOSIS — Z09 Encounter for follow-up examination after completed treatment for conditions other than malignant neoplasm: Secondary | ICD-10-CM

## 2015-07-31 MED ORDER — PREDNISONE 10 MG PO TABS: ORAL_TABLET | ORAL | Status: DC

## 2015-07-31 MED ORDER — INDOMETHACIN ER 75 MG PO CPCR: 75.0000 mg | ORAL_CAPSULE | Freq: Every day | ORAL | Status: DC | PRN

## 2015-07-31 MED ORDER — COLCHICINE 0.6 MG PO TABS: 0.6000 mg | ORAL_TABLET | Freq: Two times a day (BID) | ORAL | Status: DC | PRN

## 2015-07-31 MED ORDER — HYDROCODONE-ACETAMINOPHEN 5-325 MG PO TABS: 1.0000 | ORAL_TABLET | Freq: Three times a day (TID) | ORAL | Status: DC | PRN

## 2015-07-31 MED FILL — COLCRYS 0.6 MG TABLET: 0.6 | 7 days supply | Qty: 14 | Fill #0

## 2015-07-31 MED FILL — HYDROCODON-APAP 5-325: 5-325 | 4 days supply | Qty: 20 | Fill #0

## 2015-07-31 NOTE — Patient Instructions (Signed)
You have an acute gout flare. Take prednisone dose pack until completed. Wait to take the indomethacin again until after you're finished with the prednisone. Continue the colchicine. Call me if you want to do the injection and we can squeeze you in any time. Icing 15 minutes at a time 3-4 times a day. Elevation above your heart as much as possible. ACE wrap, your knee brace, or a sleeve for compression also as much as possible. Follow up with me in 2 weeks or as needed (but you can call sooner if you want to do the injection).

## 2015-07-31 NOTE — Progress Notes (Signed)
Pre visit review using our clinic review tool, if applicable. No additional management support is needed unless otherwise documented below in the visit note. 

## 2015-07-31 NOTE — Progress Notes (Signed)
Subjective:    Patient ID: Tyler Doyle, male    DOB: 01/05/1976, 40 y.o.   MRN: [id number]  DOS:  07/31/2015 Type of visit - description : Acute visit Interval history: 07/29/2015 he did do a lot of walking, few hours later developed left knee pain acutely, mostly on the lateral side. Denies fever chills No redness No other joints affected No calf Swelling or pain.   Review of Systems   Past Medical History  Diagnosis Date  . Anxiety and depression 05/13/2009    Qualifier: Diagnosis of  By: Drue Novel MD, Nolon Rod.   . Gout 05/13/2009    Qualifier: Diagnosis of  By: Drue Novel MD, Nolon Rod.     Past Surgical History  Procedure Laterality Date  . Appendectomy    . Cholecystectomy    . Throat surgery      vocal cord polyp    Social History   Social History  . Marital Status: Married    Spouse Name: N/A  . Number of Children: 2  . Years of Education: N/A   Occupational History  . musician    Social History Main Topics  . Smoking status: Never Smoker   . Smokeless tobacco: Never Used  . Alcohol Use: 0.0 oz/week    0 Standard drinks or equivalent per week  . Drug Use: No  . Sexual Activity: Not on file   Other Topics Concern  . Not on file   Social History Narrative   Separated from wife   Original from [place]        Medication List       This list is accurate as of: 07/31/15  6:14 PM.  Always use your most recent med list.               colchicine 0.6 MG tablet  Take 1 tablet (0.6 mg total) by mouth 2 (two) times daily as needed.     HYDROcodone-acetaminophen 5-325 MG tablet  Commonly known as:  NORCO/VICODIN  Take 1-2 tablets by mouth every 8 (eight) hours as needed.     indomethacin 75 MG CR capsule  Commonly known as:  INDOCIN SR  Take 1 capsule (75 mg total) by mouth daily as needed.     ketoconazole 2 % cream  Commonly known as:  NIZORAL  Apply 1 application topically daily.     predniSONE 10 MG tablet  Commonly known as:  DELTASONE  6 tabs  po day 1, 5 tabs po day 2, 4 tabs po day 3, 3 tabs po day 4, 2 tabs po day 5, 1 tab po day 6     sildenafil 25 MG tablet  Commonly known as:  VIAGRA  Take 2-3 tablets (50-75 mg total) by mouth daily as needed for erectile dysfunction.     zaleplon 10 MG capsule  Commonly known as:  SONATA  Take 1 capsule (10 mg total) by mouth at bedtime as needed for sleep.           Objective:   Physical Exam BP 126/82 mmHg  Pulse 77  Temp(Src) 97.7 F (36.5 C) (Oral)  Ht  (1.753 m)  Wt 291 lb (131.997 kg)  BMI 42.95 kg/m2  SpO2 95% General:   Well developed, well nourished . NAD.  HEENT:  Normocephalic . Face symmetric, atraumatic  MSK: Right knee normal Left knee: Range of motion decreased No red, minimal warmness if any + Swelling, mostly anteriorly/medially and frontally. Quadriceps seems intact. Skin: Not pale.  Not jaundice Neurologic:  alert & oriented X3.  Speech normal, gait limited by pain Psych--  Cognition and judgment appear intact.  Cooperative with normal attention span and concentration.  Behavior appropriate. No anxious or depressed appearing.      Assessment & Plan:   Assessment > Anxiety, depression, insomnia --used to see Dr. Tomasa Randunningham, started to get prescriptions here 12-2014, on prozac/sonata prn ED d/t  Prozac on Viagra prn Gout: dx in the 90s, usually one episode a year  Plan: Acute onset of left knee pain, suspect internal derangement, other consideration is gout; quadriceps rupture? it seems intact however he has some bulging proximal to the knee.  Refer to sports medicine for further eval, he may need a knee  tap to rule out gout, x-rays, MRI etc. Patient will be seen today at 2 PM. Appreciate sports medicine help. In the meantime take indomethacin, colchicine. Prescribe a small amount of hydrocodone. ICE, rest

## 2015-07-31 NOTE — Progress Notes (Signed)
PCP and consultation requested by: Willow OraJose Paz, MD  Subjective:   HPI: Patient is a 40 y.o. male here for left knee pain.  Patient reports he walked a lot on 4/17 but no injury or trauma. Later that day developed worsening pain and swelling anterior left knee. Has history of gout but never in knees. Pain level is now 9/10, worse with any movement, sharp. Worse with walking, bending, stairs. Painful getting in and out of a car. Knee is warm but no rash, no fever.  Past Medical History  Diagnosis Date  . Anxiety and depression 05/13/2009    Qualifier: Diagnosis of  By: Drue NovelPaz MD, Nolon RodJose E.   . Gout 05/13/2009    Qualifier: Diagnosis of  By: Drue NovelPaz MD, Nolon RodJose E.     Current Outpatient Prescriptions on File Prior to Visit  Medication Sig Dispense Refill  . colchicine 0.6 MG tablet Take 1 tablet (0.6 mg total) by mouth 2 (two) times daily as needed. 60 tablet 0  . HYDROcodone-acetaminophen (NORCO/VICODIN) 5-325 MG tablet Take 1-2 tablets by mouth every 8 (eight) hours as needed. 20 tablet 0  . indomethacin (INDOCIN SR) 75 MG CR capsule Take 1 capsule (75 mg total) by mouth daily as needed. 30 capsule 0  . ketoconazole (NIZORAL) 2 % cream Apply 1 application topically daily. (Patient not taking: Reported on 07/31/2015) 15 g 0  . sildenafil (VIAGRA) 25 MG tablet Take 2-3 tablets (50-75 mg total) by mouth daily as needed for erectile dysfunction. (Patient not taking: Reported on 07/31/2015) 30 tablet 0  . zaleplon (SONATA) 10 MG capsule Take 1 capsule (10 mg total) by mouth at bedtime as needed for sleep. (Patient not taking: Reported on 07/31/2015) 30 capsule 1   No current facility-administered medications on file prior to visit.    Past Surgical History  Procedure Laterality Date  . Appendectomy    . Cholecystectomy    . Throat surgery      vocal cord polyp    Allergies  Allergen Reactions  . Sulfa Antibiotics     Patient states " Felt as if I was going crazy"     Social History   Social  History  . Marital Status: Married    Spouse Name: N/A  . Number of Children: 2  . Years of Education: N/A   Occupational History  . musician    Social History Main Topics  . Smoking status: Never Smoker   . Smokeless tobacco: Never Used  . Alcohol Use: 0.0 oz/week    0 Standard drinks or equivalent per week  . Drug Use: No  . Sexual Activity: Not on file   Other Topics Concern  . Not on file   Social History Narrative   Separated from wife   Original from [place]    Family History  Problem Relation Age of Onset  . Colon cancer Neg Hx   . Prostate cancer Neg Hx   . CAD Father 7170  . Diabetes Father 65    BP 118/79 mmHg  Pulse 91  Ht 5\' 9"  (1.753 m)  Wt 290 lb (131.543 kg)  BMI 42.81 kg/m2  Review of Systems: See HPI above.    Objective:  Physical Exam:  Gen: NAD, comfortable in exam room  Left knee: Moderate effusion.  No other gross deformity, ecchymoses. Diffuse anterior tenderness (joint lines and suprapatellar pouch). ROM 0 - 90 degrees, painful. Negative ant/post drawers. Negative valgus/varus testing. Negative lachmanns. Pain with mcmurrays, apleys. NV intact distally.  Right  knee: FROM without pain.    MSK u/s left knee: confirms presence of effusion.  Patellar and quad tendons intact without abnormalities.  Assessment & Plan:  1. Left knee pain - severe pain without injury, history of gout, presence of effusion all consistent with acute gout flare.  He has started taking the indomethacin and colchicine prescribed by Dr. Drue Novel.  We discussed a few options - continuing with these medications, prednisone dose pack then transition back to indomethacin (he could take colchicine with this), or aspiration and injection of the knee.  He would like to wait on the aspiration and injection for now but he would like to try prednisone dose pack.  Advised him to call us if he would like to proceed with aspiration and injection.  F/u in 2 weeks but can call us  sooner.

## 2015-07-31 NOTE — Patient Instructions (Addendum)
Please see the sports medicine doctor today  Ice, rest, leg elevation  Indocin as needed Colchicine twice a day Hydrocodone if pain persists, will cause drowsiness

## 2015-07-31 NOTE — Assessment & Plan Note (Signed)
Acute onset of left knee pain, suspect internal derangement, other consideration is gout; quadriceps rupture? it seems intact however he has some bulging proximal to the knee.  Refer to sports medicine for further eval, he may need a knee  tap to rule out gout, x-rays, MRI etc. Patient will be seen today at 2 PM. Appreciate sports medicine help. In the meantime take indomethacin, colchicine. Prescribe a small amount of hydrocodone. ICE, rest

## 2015-08-01 DIAGNOSIS — M25562 Pain in left knee: Secondary | ICD-10-CM | POA: Insufficient documentation

## 2015-08-01 NOTE — Assessment & Plan Note (Signed)
severe pain without injury, history of gout, presence of effusion all consistent with acute gout flare.  He has started taking the indomethacin and colchicine prescribed by Dr. Drue NovelPaz.  We discussed a few options - continuing with these medications, prednisone dose pack then transition back to indomethacin (he could take colchicine with this), or aspiration and injection of the knee.  He would like to wait on the aspiration and injection for now but he would like to try prednisone dose pack.  Advised him to call us if he would like to proceed with aspiration and injection.  F/u in 2 weeks but can call us sooner.

## 2015-10-02 ENCOUNTER — Telehealth: Payer: Self-pay | Admitting: *Deleted

## 2015-10-02 MED ORDER — COLCHICINE 0.6 MG PO TABS: ORAL_TABLET | ORAL | Status: DC

## 2015-10-02 NOTE — Telephone Encounter (Signed)
PA submitted on [email or web address], awaiting determination. JG//CMA  

## 2015-10-02 NOTE — Telephone Encounter (Signed)
Yes

## 2015-10-02 NOTE — Telephone Encounter (Signed)
PA denied for colchicine, but will pay for brand Colcrys. Ok to switch? Please advise. JG//CMA

## 2015-10-02 NOTE — Addendum Note (Signed)
Addended by: Amado CoeGLOVER, Seymour Pavlak A on: 10/02/2015 04:49 PM   Modules accepted: Orders, Medications

## 2015-10-02 NOTE — Telephone Encounter (Signed)
Medication sent in to [facility] pharmacy as brand name only. JG//CMA

## 2016-07-01 ENCOUNTER — Ambulatory Visit (INDEPENDENT_AMBULATORY_CARE_PROVIDER_SITE_OTHER): Payer: BLUE CROSS/BLUE SHIELD | Admitting: Internal Medicine

## 2016-07-01 ENCOUNTER — Encounter: Payer: Self-pay | Admitting: Internal Medicine

## 2016-07-01 VITALS — BP 126/68 | HR 68 | Temp 98.0°F | Resp 14 | Ht 69.0 in | Wt 279.1 lb

## 2016-07-01 DIAGNOSIS — Z0189 Encounter for other specified special examinations: Secondary | ICD-10-CM

## 2016-07-01 DIAGNOSIS — R5383 Other fatigue: Secondary | ICD-10-CM

## 2016-07-01 LAB — BASIC METABOLIC PANEL
BUN: 15 mg/dL (ref 6–23)
CO2: 28 mEq/L (ref 19–32)
Calcium: 9.6 mg/dL (ref 8.4–10.5)
Chloride: 107 mEq/L (ref 96–112)
Creatinine, Ser: 0.99 mg/dL (ref 0.40–1.50)
GFR: 87.86 mL/min (ref >60.00)
GLUCOSE: 112 mg/dL — AB (ref 70–99)
Potassium: 3.8 mEq/L (ref 3.5–5.1)
SODIUM: 142 meq/L (ref 135–145)

## 2016-07-01 LAB — VITAMIN B12: VITAMIN B 12: 262 pg/mL (ref 211–911)

## 2016-07-01 LAB — FOLATE: FOLATE: 18.7 ng/mL (ref >5.9)

## 2016-07-01 LAB — TSH: TSH: 5.18 u[IU]/mL — AB (ref 0.35–4.50)

## 2016-07-01 MED ORDER — KETOCONAZOLE 2 % EX CREA: 1.0000 "application " | TOPICAL_CREAM | Freq: Every day | CUTANEOUS | 1 refills | Status: DC

## 2016-07-01 NOTE — Progress Notes (Signed)
Pre visit review using our clinic review tool, if applicable. No additional management support is needed unless otherwise documented below in the visit note. 

## 2016-07-01 NOTE — Assessment & Plan Note (Signed)
Fatigue: long h/o fatigue associated with obesity, snoring, Epworth scale was 9: +. I see enough evidence for a referral to neurology to rule out a sleep apnea. We'll arrange. For completeness we'll get a BMP, TSH, B12 and folic acid. Morbid obesity: Advise patient that regardless of sleep apnea workup, he needs to redouble his efforts to lose weight through a healthier diet and increase exercise. Anxiety, depression, insomnia: Currently taking no medications, feeling well; states depression was from loosing is parents RTC 6 months CPX

## 2016-07-01 NOTE — Patient Instructions (Signed)
GO TO THE LAB : Get the blood work     GO TO THE FRONT DESK Schedule your next appointment for a  physical exam in 6 months  

## 2016-07-01 NOTE — Progress Notes (Signed)
Subjective:    Patient ID: Tyler Doyle, male    DOB: 08/03/1975, 41 y.o.   MRN: [id number]  DOS:  07/01/2016 Type of visit - description : acute Interval history: Long history of low energy, mostly in the mornings, even after he sleeps for 6 or 8 hours.  He is concerned about sleep apnea, he has been witnessed to snore and having episodes of apnea.   Review of Systems  Denies chest pain, difficulty breathing, lower extremity edema. Occasional DOE only if he tries to walk long distances. He has been obese for long time.  Past Medical History:  Diagnosis Date  . Anxiety and depression 05/13/2009   Qualifier: Diagnosis of  By: Drue Novel MD, Nolon Rod.   . Gout 05/13/2009   Qualifier: Diagnosis of  By: Drue Novel MD, Nolon Rod.     Past Surgical History:  Procedure Laterality Date  . APPENDECTOMY    . CHOLECYSTECTOMY    . THROAT SURGERY     vocal cord polyp    Social History   Social History  . Marital status: Married    Spouse name: N/A  . Number of children: 2  . Years of education: N/A   Occupational History  . musician    Social History Main Topics  . Smoking status: Never Smoker  . Smokeless tobacco: Never Used  . Alcohol use 0.0 oz/week  . Drug use: No  . Sexual activity: Not on file   Other Topics Concern  . Not on file   Social History Narrative   Separated from wife   Original from [place]      Allergies as of 07/01/2016      Reactions   Sulfa Antibiotics    Patient states " Felt as if I was going crazy"       Medication List       Accurate as of 07/01/16 12:46 PM. Always use your most recent med list.          ketoconazole 2 % cream Commonly known as:  NIZORAL Apply 1 application topically daily.          Objective:   Physical Exam BP 126/68 (BP Location: Left Arm, Patient Position: Sitting, Cuff Size: Normal)   Pulse 68   Temp 98 F (36.7 C) (Oral)   Resp 14   Ht 5\' 9"  (1.753 m)   Wt 279 lb 2 oz (126.6 kg)   SpO2 97%   BMI 41.22 kg/m    General:   Well developed, well nourished . NAD.  HEENT:  Normocephalic . Face symmetric, atraumatic. Throat crowded Lungs:  CTA B Normal respiratory effort, no intercostal retractions, no accessory muscle use. Heart: RRR,  no murmur.  No pretibial edema bilaterally  Skin: Not pale. Not jaundice Neurologic:  alert & oriented X3.  Speech normal, gait appropriate for age and unassisted Psych--  Cognition and judgment appear intact.  Cooperative with normal attention span and concentration.  Behavior appropriate. No anxious or depressed appearing.      Assessment & Plan:    Assessment > Anxiety, depression, insomnia --used to see Dr. Tomasa Rand, started to get prescriptions here 12-2014, on prozac/sonata prn; self d/c prozac 2017 ED d/t  Prozac on Viagra prn Gout: dx in the 90s, usually one episode a year Morbid obesity  PLAN: Fatigue: long h/o fatigue associated with obesity, snoring, Epworth scale was 9: +. I see enough evidence for a referral to neurology to rule out a sleep apnea. We'll arrange. For completeness  we'll get a BMP, TSH, B12 and folic acid. Morbid obesity: Advise patient that regardless of sleep apnea workup, he needs to redouble his efforts to lose weight through a healthier diet and increase exercise. Anxiety, depression, insomnia: Currently taking no medications, feeling well; states depression was from loosing is parents RTC 6 months CPX

## 2016-07-09 IMAGING — CR DG CHEST 2V
2 series · 2 of 2 positions shown · non-contrast
Comparison: None.

CLINICAL DATA: Shortness of breath, heart palpitations for the past
4 nights.

EXAM:
CHEST  2 VIEW

[chest pa]
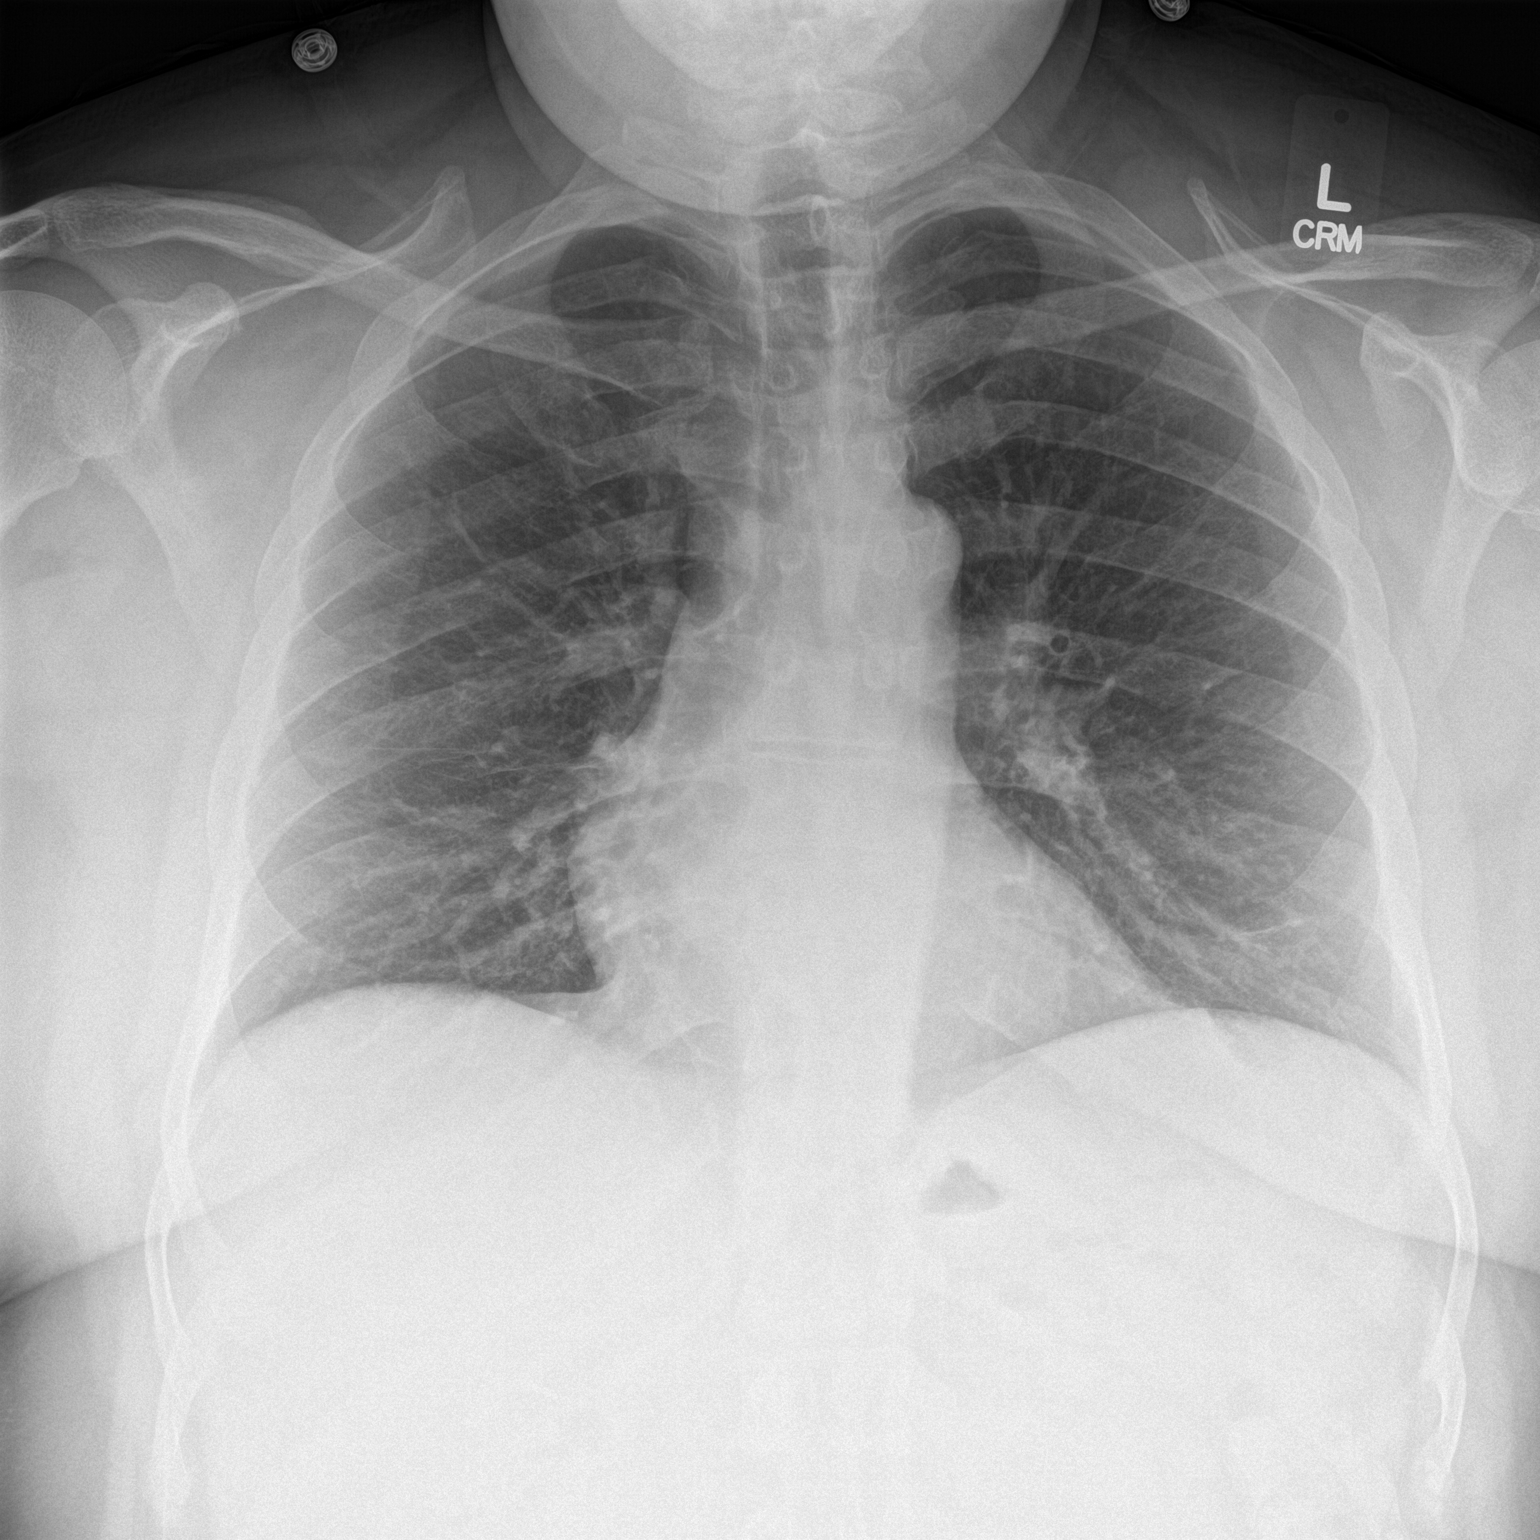

[chest lat]
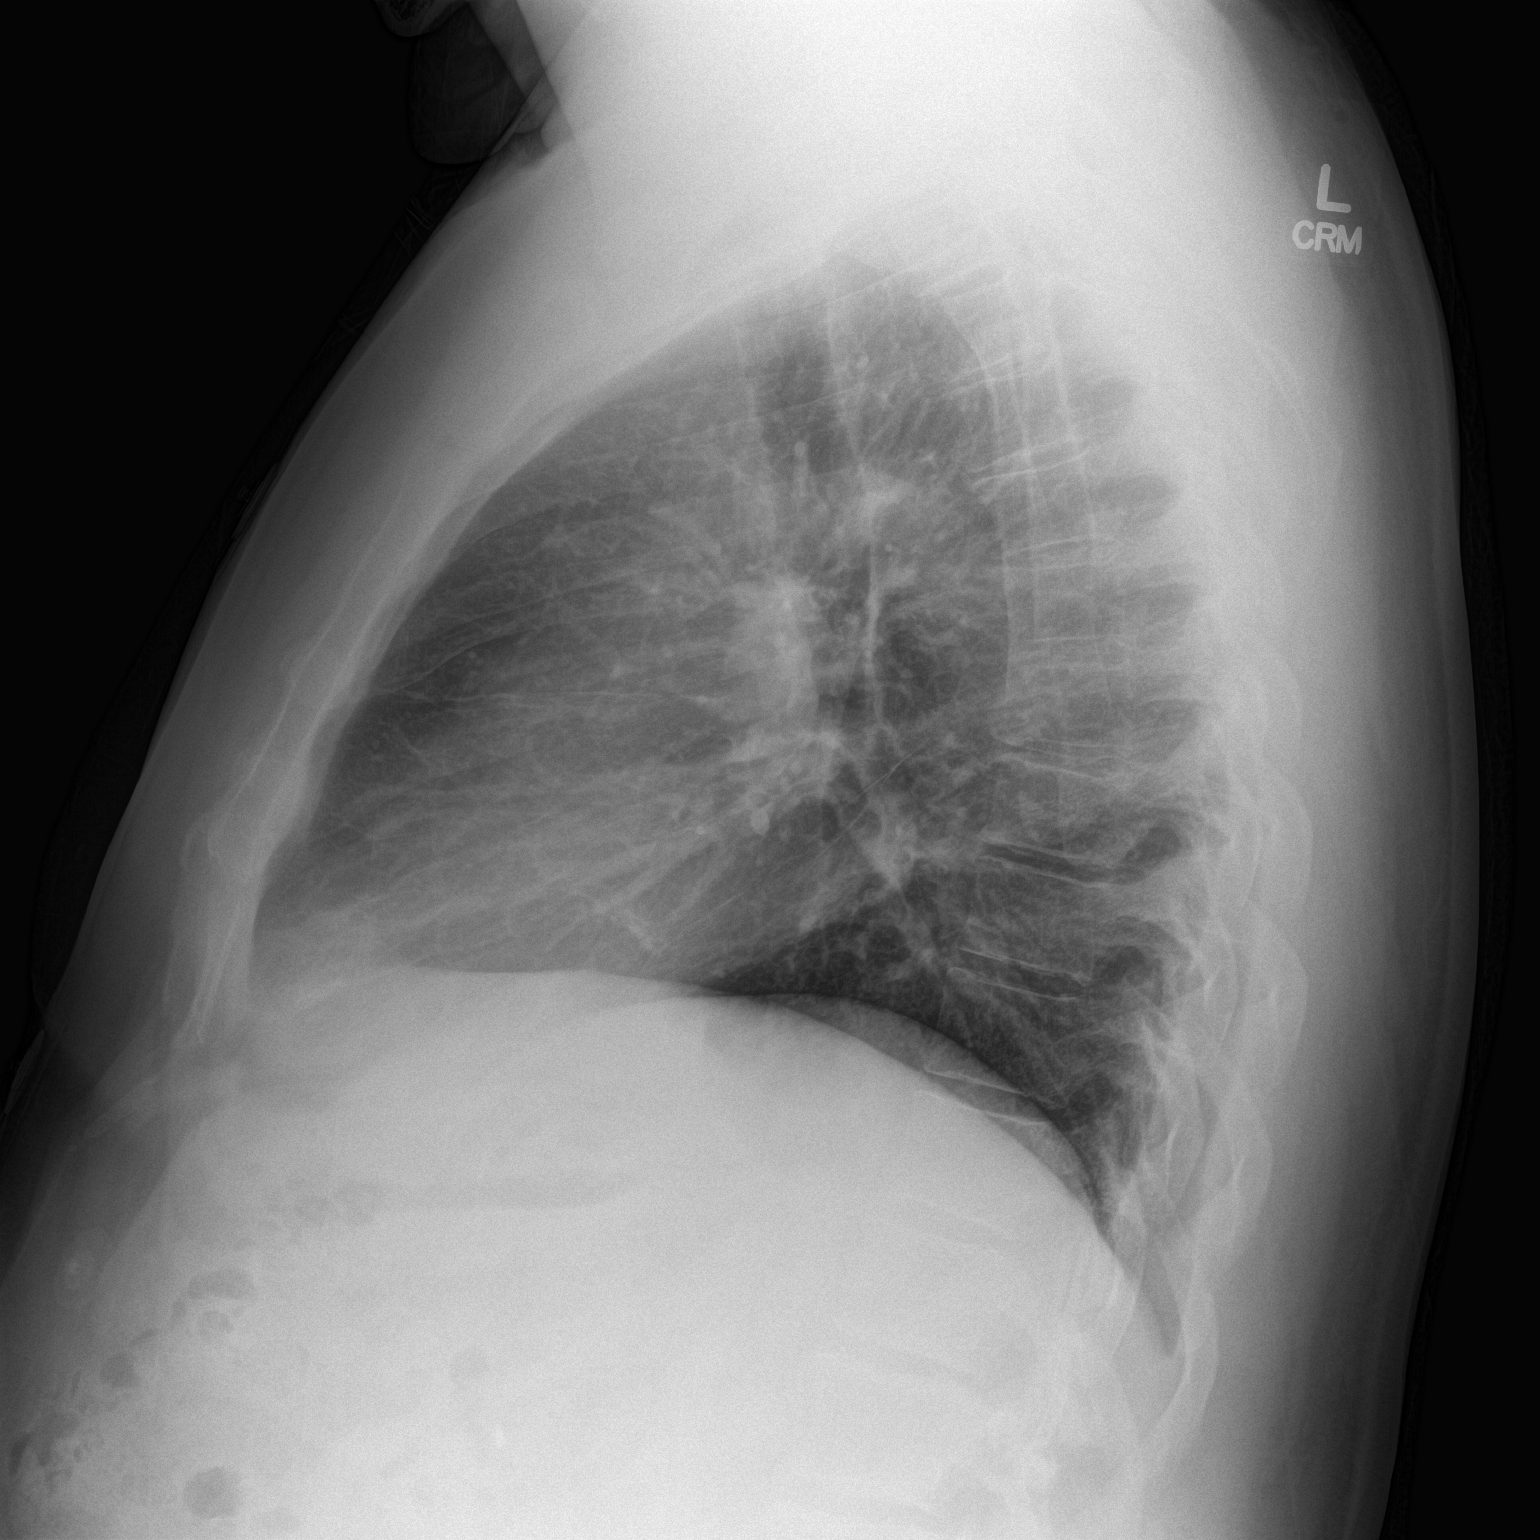

[2 of 2 positions shown; findings below may reference images not displayed]

FINDINGS: The heart size and mediastinal contours are within normal limits.
Both lungs are clear. The visualized skeletal structures are
unremarkable.
IMPRESSION: No active cardiopulmonary disease.

## 2016-07-27 ENCOUNTER — Telehealth: Payer: Self-pay | Admitting: *Deleted

## 2016-07-27 ENCOUNTER — Institutional Professional Consult (permissible substitution): Payer: BLUE CROSS/BLUE SHIELD | Admitting: Neurology

## 2016-07-27 NOTE — Telephone Encounter (Signed)
Spoke to patient - he is aware our office is without power this morning. Offered him an appt w/ Dr. Vickey Huger on 08/19/16 but he would like to be seen sooner.  He is agreeable to see either of our sleep specialist.  Please call him back to reschedule.

## 2016-07-29 NOTE — Telephone Encounter (Signed)
Sending this to [facility], she handles Sleep consults

## 2016-07-29 NOTE — Telephone Encounter (Signed)
This one is sleep I am sending to you dg

## 2016-08-24 ENCOUNTER — Encounter: Payer: Self-pay | Admitting: Neurology

## 2016-08-24 ENCOUNTER — Ambulatory Visit (INDEPENDENT_AMBULATORY_CARE_PROVIDER_SITE_OTHER): Payer: BLUE CROSS/BLUE SHIELD | Admitting: Neurology

## 2016-08-24 VITALS — BP 136/85 | HR 77 | Resp 20 | Ht 70.0 in | Wt 278.0 lb

## 2016-08-24 DIAGNOSIS — R0683 Snoring: Secondary | ICD-10-CM | POA: Diagnosis not present

## 2016-08-24 DIAGNOSIS — G4733 Obstructive sleep apnea (adult) (pediatric): Secondary | ICD-10-CM | POA: Diagnosis not present

## 2016-08-24 DIAGNOSIS — G4710 Hypersomnia, unspecified: Secondary | ICD-10-CM

## 2016-08-24 DIAGNOSIS — E669 Obesity, unspecified: Secondary | ICD-10-CM

## 2016-08-24 DIAGNOSIS — G4730 Sleep apnea, unspecified: Secondary | ICD-10-CM

## 2016-08-24 NOTE — Progress Notes (Signed)
SLEEP MEDICINE CLINIC   Provider:  Melvyn Novasarmen  Tyler Doyle, M D  Primary Care Physician:  Wanda PlumpPaz, Tyler E, MD   Referring Provider: Wanda PlumpPaz, Tyler E, MD    Chief Complaint  Patient presents with  . New Patient (Initial Visit)    snores, stops breathing, never had a sleep study    HPI:  Tyler Doyle is a 41 y.o. male , seen here as in a referral  from Dr. Drue NovelPaz for a sleep evaluation.  Mr. Tyler Doyle is a 41 year old divorced gentleman,  Originally from [place], living in the [place] for the last 10 years. He reports that she snores and wakes himself sometimes up snoring, he is bothered by a feeling of fatigue and not ever getting enough rest. When he wakes up in the morning he has a hard time starting his day. He has been witnessed to snore and to have apnea. " I divorced recently, a couple of weeks ago "; He also just got divorced last month, and this caused more anxiety and depression. Last year , he stopped Prozac after 6 years. He states he had less trouble to sleep after he stopped Prozac and started melatonin.   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 More so in the past and currently. He considers 7 hours at night of sleep a good night.   Chief complaint according to patient :" I need more sleep or better sleep"  Sleep habits are as follows: His usual bedtime is before midnight, usually has watched an hour of TV or more before he goes to bed. Prn  melatonin affects time initiating sleep, he is asleep promptly, He always prefers the right side to sleep on, using no pillows and he often sleeps on his couch. The room is dark, quiet and  cool. He does have tinnitus which of affects his sleep is more of a hissing sound, this affects both ears. The tinnitus was thought to be occupational related. He dreams frequently, sometimes with vivid content. He has allergic rhinitis and is mouth breathing, sometimes dreams he is short of breath, wakes  gasping, choking.    Usually, Mr. Tyler Doyle can sleep through the night he may have one bathroom break but not every night. His alarm is set for the morning for 7 AM. He feels very fatigued in the morning, but denies headaches diaphoresis, palpitations. In the morning there is no tinnitus. With difficulties he will start his day but he has noted a severe slump after lunch and often drinks a coffee to help him out. He may take naps if he can.    Sleep medical history and family sleep history:  Both parents were snorers, both had OSA.  Has siblings, non diagnosed. He was a sleep walker as a child.   Social history: both parents died within weeks of another 2017, he was under a lot of stress and developed tinnitus at that time. Insomnia. 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 Divorced but with good relation to former spouse.  Non smoker, ETOH 2-3 weekly, caffeine user , 1 cup. No sodas, no iced tea.   Review of Systems: Out of a complete 14 system review, the patient complains of only the following symptoms, and all other reviewed systems are negative. Snoring, witnessed apnea, tinnitus- ringing initially , now a hissing - newly with  pulsation  Epworth score 13 , Fatigue severity score 49  ,  depression score 2/15    Social History   Social History  . Marital status: Married    Spouse name: N/A  . Number of children: 2  . Years of education: N/A   Occupational History  . musician    Social History Main Topics  . Smoking status: Never Smoker  . Smokeless tobacco: Never Used  . Alcohol use 0.0 oz/week  . Drug use: No  . Sexual activity: Not on file   Other Topics Concern  . Not on file   Social History Narrative   Separated from wife   Original from [place]    Family History  Problem Relation Age of Onset  . CAD Father 93  . Diabetes Father 69  . Colon cancer Neg Hx   . Prostate cancer Neg Hx     Past Medical History:  Diagnosis Date  .  Anxiety and depression 05/13/2009   Qualifier: Diagnosis of  By: Drue Novel MD, Nolon Rod.   . Gout 05/13/2009   Qualifier: Diagnosis of  By: Drue Novel MD, Nolon Rod.     Past Surgical History:  Procedure Laterality Date  . APPENDECTOMY    . CHOLECYSTECTOMY    . THROAT SURGERY     vocal cord polyp    Current Outpatient Prescriptions  Medication Sig Dispense Refill  . ketoconazole (NIZORAL) 2 % cream Apply 1 application topically daily. 30 g 1   No current facility-administered medications for this visit.     Allergies as of 08/24/2016 - Review Complete 08/24/2016  Allergen Reaction Noted  . Sulfa antibiotics  06/19/2011    Vitals: BP 136/85   Pulse 77   Resp 20   Ht 5\' 10"  (1.778 m)   Wt 278 lb (126.1 kg)   BMI 39.89 kg/m  Last Weight:  Wt Readings from Last 1 Encounters:  08/24/16 278 lb (126.1 kg)   ZOX:WRUE mass index is 39.89 kg/m.     Last Height:   Ht Readings from Last 1 Encounters:  08/24/16 5\' 10"  (1.778 m)    Physical exam:  General: The patient is awake, alert and appears not in acute distress. The patient is well groomed. Head: Normocephalic, atraumatic. Neck is supple. Mallampati 4,  neck circumference:19. Nasal airflow congestion , TMJ click  is not evident . Retrognathia is seen.  Cardiovascular:  Regular rate and rhythm, without  murmurs or carotid bruit, and without distended neck veins. Respiratory: Lungs are clear to auscultation. Skin:  Without evidence of edema, or rash Trunk: BMI is 40. The patient's posture is slumped    Neurologic exam : The patient is awake and alert, oriented to place and time.   Memory subjective described as intact.  Attention span & concentration ability appears normal.  Speech is fluent, without dysarthria, dysphonia or aphasia.  Mood and affect are appropriate. Cranial nerves: Pupils are equal and briskly reactive to light. Funduscopic exam without  evidence of pallor or edema.  Extraocular movements  in vertical and horizontal  planes intact and without nystagmus. Visual fields by finger perimetry are intact.Hearing to finger rub intact. Facial sensation intact to fine touch. Facial motor strength is symmetric and tongue and uvula move midline. Shoulder shrug was symmetrical.  Motor exam:  Normal tone, muscle bulk and symmetric strength in all extremities. Sensory:  Fine touch, pinprick and vibration - Proprioception tested in the upper extremities was normal. Coordination: Finger-to-nose maneuver  normal without evidence of ataxia, dysmetria or tremor. Gait and station: Patient walks without assistive device and  is able unassisted to climb up to the exam table. Strength within normal limits.Stance is stable and normal. Tandem gait is unfragmented. Turns with 3 Steps. Romberg testing is negative. Deep tendon reflexes: in the upper and lower extremities are symmetric and intact. Babinski maneuver response is  downgoing.    Assessment:  After physical and neurologic examination, review of laboratory studies,  Personal review of imaging studies, reports of other /same  Imaging studies, results of polysomnography and / or neurophysiology testing and pre-existing records as far as provided in visit., my assessment is   1) super obese after a successful 60 pounds weight loss- still this is his main risk factor. He battles a craving of high carb foods in AM>   2) large neck and high grade Mallampati. Neither DM, HTN nor strokes or heart disease.   3) snoring and apnea were witnessed- I am sure he has OSA, will confirm by Test. No previous PSG.    The patient was advised of the nature of the diagnosed disorder , the treatment options and the  risks for general health and wellness arising from not treating the condition.   I spent more than 35 minutes of face to face time with the patient. Greater than 50% of time was spent in counseling and coordination of care. We have discussed the diagnosis and differential and I answered  the patient's questions.    Plan:  Treatment plan and additional workup : [employer] health plan.   Order HST .  Melvyn Novas, MD 08/24/2016, 1:17 PM  Certified in Neurology by ABPN Certified in Sleep Medicine by [facility] [address], [address] [place], [place] [address]

## 2016-08-24 NOTE — Patient Instructions (Signed)

## 2016-09-14 ENCOUNTER — Ambulatory Visit (INDEPENDENT_AMBULATORY_CARE_PROVIDER_SITE_OTHER): Payer: BLUE CROSS/BLUE SHIELD | Admitting: Neurology

## 2016-09-14 DIAGNOSIS — E669 Obesity, unspecified: Secondary | ICD-10-CM

## 2016-09-14 DIAGNOSIS — G4730 Sleep apnea, unspecified: Secondary | ICD-10-CM

## 2016-09-14 DIAGNOSIS — G4710 Hypersomnia, unspecified: Secondary | ICD-10-CM

## 2016-09-14 DIAGNOSIS — R0683 Snoring: Secondary | ICD-10-CM

## 2016-09-17 NOTE — Addendum Note (Signed)
Addended by: Melvyn NovasHMEIER, [place] Baria on: 09/17/2016 04:36 PM   Modules accepted: Orders

## 2016-09-17 NOTE — Procedures (Signed)
NAME: Tyler Doyle [place]                      DOB: 11-03-1975 MEDICAL RECORD [id number]        DOS: 09/14/16 REFERRING PHYSICIAN: Wanda PlumpJose E Paz, MD                   Study performed: HST HISTORY: Tyler Doyle Ros[place] reports that he wakes himself up by snoring, he is bothered by a feeling of fatigue and not ever getting enough rest. When he wakes up in the morning he has a hard time starting his day. He has been witnessed to snore and to have apnea. He was treated for depression, but stopped Prozac after 6 years. He states he had less trouble to sleep after he stopped Prozac and started melatonin.   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 He considers 7 hours at night of sleep a good night.   Chief complaint according to patient:" I need more sleep or better sleep" Epworth score 13 , Fatigue severity score 49  , depression score 2/15 , BMI 40.   STUDY RESULTS: Total Recording Time: 3h 1426m   Total Apnea/Hypopnea Index (AHI): 19.4/hr. Average Oxygen Saturation: SpO2 92%. Lowest Oxygen Saturation: Nadir SpO2 72%. Hypoxemia was 11 minutes total. Average Heart Rate: 49 bpm (40-90 range bpm)  IMPRESSION:  HST does reveal evidence of obstructive sleep apnea. He also has a high RDI, indicating snoring. No prolonged hypoxemia.  RECOMMENDATION:  CPAP is an option, as it will address both ; apnea and snoring. He could try a dental device as his apnea is not associated with prolonged oxygen desaturation. Weight loss is encouraged.  The patient may do best with an auto-titration try out, I will order a CPAP 5-20 cm water , interface of choice.  I certify that I have reviewed the raw data recording prior to the issuance of this report in accordance with the standards of Accreditation of the American Academy of Sleep medicine (AASM) Melvyn Novasarmen Gerrald Basu, MD        09-17-2016  Diplomat, American Board of Psychiatry and Neurology Diplomat, American Board  of Sleep Medicine Medical Director of [facility]

## 2016-09-23 ENCOUNTER — Telehealth: Payer: Self-pay

## 2016-09-23 NOTE — Telephone Encounter (Signed)
Patient called office returning RN's call.  Please call °

## 2016-09-23 NOTE — Telephone Encounter (Signed)
-----   Message from Melvyn Novasarmen Dohmeier, MD sent at 09/17/2016  4:36 PM EDT ----- IMPRESSION:  HST does reveal evidence of obstructive sleep apnea with an Ahi of 19.4. He also has a high RDI, indicating snoring. No prolonged hypoxemia.  RECOMMENDATION:  CPAP is an option, as it will address both ; apnea and snoring. He could try a dental device as his apnea is not associated with prolonged oxygen desaturation. Weight loss is encouraged.  Order for auto-titration CPAP 5-20 cm water , interface of choice.

## 2016-09-23 NOTE — Telephone Encounter (Signed)
I called pt to discuss his sleep study results. No answer, left a message asking him to call me back. 

## 2016-09-23 NOTE — Telephone Encounter (Addendum)
I called pt. I advised pt that Dr.Dohmeier reviewed their sleep study results and found that pt has evidence of osa with an AHI of 19.4/hr, snoring, but no prolonged hypoxemia. Dr.Dohmeier recommends that pt starts CPAP to help with apnea and snoring. Pt declined the dental device and agreed to CPAP. I reviewed PAP compliance expectations with the pt. Pt is agreeable to starting a CPAP. I advised pt that an order will be sent to a DME, [facility], and [facility] will call the pt within about one week after they file with the pt's insurance. [facility] will show the pt how to use the machine, fit for masks, and troubleshoot the CPAP if needed. A follow up appt was made for insurance purposes with Dr. Vickey Hugerohmeier on Thursday, Oct 4th and 11:00am. Pt verbalized understanding to arrive 15 minutes early and bring their CPAP. A letter with all of this information in it will be mailed to the pt as a reminder. I verified with the pt that the address we have on file is correct. Pt verbalized understanding of results. Pt had no questions at this time but was encouraged to call back if questions arise.

## 2016-11-13 ENCOUNTER — Telehealth: Payer: Self-pay | Admitting: Internal Medicine

## 2016-11-13 NOTE — Telephone Encounter (Addendum)
°  Relation to pt: self  Call back number:[phone] [id number] [place] - [address] [facility] [address] [phone] (Phone) [phone] (Fax)     Reason for call:  Patient requesting gout rx, please send to retail, patient would like Rx sent today,please advise

## 2016-11-13 NOTE — Telephone Encounter (Signed)
Patient informed of message below and will call back to schedule

## 2016-11-13 NOTE — Telephone Encounter (Signed)
Per chart, Pt has not been treated w/ gout recently and PCP out of office until Monday. Will need OV w/ another provider.

## 2016-11-16 ENCOUNTER — Ambulatory Visit: Payer: BLUE CROSS/BLUE SHIELD | Admitting: Nurse Practitioner

## 2016-11-16 ENCOUNTER — Encounter: Payer: Self-pay | Admitting: Family Medicine

## 2016-11-16 ENCOUNTER — Ambulatory Visit (INDEPENDENT_AMBULATORY_CARE_PROVIDER_SITE_OTHER): Payer: Self-pay | Admitting: Family Medicine

## 2016-11-16 VITALS — BP 112/86 | HR 82 | Temp 98.3°F | Wt 278.0 lb

## 2016-11-16 DIAGNOSIS — M109 Gout, unspecified: Secondary | ICD-10-CM

## 2016-11-16 LAB — CBC WITH DIFFERENTIAL/PLATELET
Basophils Absolute: 0 10*3/uL (ref 0.0–0.1)
Basophils Relative: 0.6 % (ref 0.0–3.0)
Eosinophils Absolute: 0.1 10*3/uL (ref 0.0–0.7)
Eosinophils Relative: 1.9 % (ref 0.0–5.0)
HCT: 42.6 % (ref 39.0–52.0)
Hemoglobin: 14.6 g/dL (ref 13.0–17.0)
LYMPHS ABS: 1.1 10*3/uL (ref 0.7–4.0)
Lymphocytes Relative: 18.4 % (ref 12.0–46.0)
MCHC: 34.3 g/dL (ref 30.0–36.0)
MCV: 93.8 fl (ref 78.0–100.0)
MONO ABS: 0.5 10*3/uL (ref 0.1–1.0)
Monocytes Relative: 9 % (ref 3.0–12.0)
NEUTROS ABS: 4.2 10*3/uL (ref 1.4–7.7)
NEUTROS PCT: 70.1 % (ref 43.0–77.0)
Platelets: 209 10*3/uL (ref 150.0–400.0)
RBC: 4.54 Mil/uL (ref 4.22–5.81)
RDW: 14.1 % (ref 11.5–15.5)
WBC: 5.9 10*3/uL (ref 4.0–10.5)

## 2016-11-16 LAB — BASIC METABOLIC PANEL
BUN: 10 mg/dL (ref 6–23)
CO2: 26 meq/L (ref 19–32)
Calcium: 9.2 mg/dL (ref 8.4–10.5)
Chloride: 107 mEq/L (ref 96–112)
Creatinine, Ser: 0.95 mg/dL (ref 0.40–1.50)
GFR: 91.98 mL/min (ref >60.00)
Glucose, Bld: 98 mg/dL (ref 70–99)
Potassium: 4 mEq/L (ref 3.5–5.1)
Sodium: 142 mEq/L (ref 135–145)

## 2016-11-16 LAB — URIC ACID: URIC ACID, SERUM: 8.2 mg/dL — AB (ref 4.0–7.8)

## 2016-11-16 MED ORDER — INDOMETHACIN 50 MG PO CAPS: 50.0000 mg | ORAL_CAPSULE | Freq: Three times a day (TID) | ORAL | 0 refills | Status: DC

## 2016-11-16 NOTE — Progress Notes (Signed)
Subjective:    Patient ID: Tyler Doyle, male    DOB: Aug 21, 1975, 41 y.o.   MRN: [id number]  HPI  Symptoms started 3 days ago with pain in the left foot. Associated limping and pain has worsened and is noted as worse with going up and down steps.  Associated swelling which has been noted with previous gout episodes.  No history of trauma or injury.  He reports rare use of alcohol and reports drinking only once/year He denies fever, chills, sweats, N/V/D, or rash No alleviating factors noted; aggravating factor of walking up/down stairs and use of close toe shoes  Review of Systems  Constitutional: Negative for chills, fatigue and fever.  Respiratory: Negative for cough, shortness of breath and wheezing.   Cardiovascular: Negative for chest pain and palpitations.  Gastrointestinal: Negative for abdominal pain, diarrhea, nausea and vomiting.  Musculoskeletal:       Left foot pain  Skin: Negative for pallor.   Past Medical History:  Diagnosis Date  . Anxiety and depression 05/13/2009   Qualifier: Diagnosis of  By: Drue NovelPaz MD, Nolon RodJose E.   . Gout 05/13/2009   Qualifier: Diagnosis of  By: Drue NovelPaz MD, Nolon RodJose E.      Social History   Social History  . Marital status: Married    Spouse name: N/A  . Number of children: 2  . Years of education: N/A   Occupational History  . musician    Social History Main Topics  . Smoking status: Never Smoker  . Smokeless tobacco: Never Used  . Alcohol use 0.0 oz/week  . Drug use: No  . Sexual activity: Not on file   Other Topics Concern  . Not on file   Social History Narrative   Separated from wife   Original from [place]    Past Surgical History:  Procedure Laterality Date  . APPENDECTOMY    . CHOLECYSTECTOMY    . THROAT SURGERY     vocal cord polyp    Family History  Problem Relation Age of Onset  . CAD Father 970  . Diabetes Father 10365  . Colon cancer Neg Hx   . Prostate cancer Neg Hx     Allergies  Allergen Reactions  .  Sulfa Antibiotics     Patient states " Felt as if I was going crazy"     Current Outpatient Prescriptions on File Prior to Visit  Medication Sig Dispense Refill  . ketoconazole (NIZORAL) 2 % cream Apply 1 application topically daily. 30 g 1   No current facility-administered medications on file prior to visit.     BP 112/86 (BP Location: Left Arm, Patient Position: Sitting, Cuff Size: Normal)   Pulse 82   Temp 98.3 F (36.8 C) (Oral)   Wt 278 lb (126.1 kg)   SpO2 95%   BMI 39.89 kg/m       Objective:   Physical Exam  Constitutional: He is oriented to person, place, and time. He appears well-developed and well-nourished.  Eyes: Pupils are equal, round, and reactive to light. No scleral icterus.  Neck: Neck supple.  Cardiovascular: Normal rate, regular rhythm and intact distal pulses.   Pulmonary/Chest: Effort normal and breath sounds normal. He has no wheezes. He has no rales.  Musculoskeletal:  Mild tenderness to palpation on left side of foot near 5th toe. Very mild swelling and erythema with mild tenderness to external malleolus.  Lymphadenopathy:    He has no cervical adenopathy.  Neurological: He is alert and oriented to  person, place, and time.  Skin: Skin is warm and dry. No rash noted.  Maceration between toes bilaterally  Psychiatric: He has a normal mood and affect. His behavior is normal. Judgment and thought content normal.      Assessment & Plan:  1. Acute gout of left foot, unspecified cause Exam and history are consistent with gout, other DDX includes cellulitis however gout is most likely at this point. History of gout noted and patient reports seeking help earlier today as he has experienced quite of bit of pain previously with gout. Treat with indomethacin and advised him to take this with food; check CBC, BMP, and uric acid today. Advised follow up with PCP if symptoms are not improving with treatment - indomethacin (INDOCIN) 50 MG capsule; Take 1 capsule  (50 mg total) by mouth 3 (three) times daily with meals.  Dispense: 21 capsule; Refill: 0 - CBC with Differential/Platelet - Basic metabolic panel  Roddie Mc, FNP-C  - Uric acid

## 2016-11-16 NOTE — Patient Instructions (Signed)
Please take medication as directed with food and follow up with Dr. Drue Novel if symptoms do not improve with treatment, worsen, or you develop new symptoms.   Gout Gout is painful swelling that can occur in some of your joints. Gout is a type of arthritis. This condition is caused by having too much uric acid in your body. Uric acid is a chemical that forms when your body breaks down substances called purines. Purines are important for building body proteins. When your body has too much uric acid, sharp crystals can form and build up inside your joints. This causes pain and swelling. Gout attacks can happen quickly and be very painful (acute gout). Over time, the attacks can affect more joints and become more frequent (chronic gout). Gout can also cause uric acid to build up under your skin and inside your kidneys. What are the causes? This condition is caused by too much uric acid in your blood. This can occur because:  Your kidneys do not remove enough uric acid from your blood. This is the most common cause.  Your body makes too much uric acid. This can occur with some cancers and cancer treatments. It can also occur if your body is breaking down too many red blood cells (hemolytic anemia).  You eat too many foods that are high in purines. These foods include organ meats and some seafood. Alcohol, especially beer, is also high in purines.  A gout attack may be triggered by trauma or stress. What increases the risk? This condition is more likely to develop in people who:  Have a family history of gout.  Are male and middle-aged.  Are male and have gone through menopause.  Are obese.  Frequently drink alcohol, especially beer.  Are dehydrated.  Lose weight too quickly.  Have an organ transplant.  Have lead poisoning.  Take certain medicines, including aspirin, cyclosporine, diuretics, levodopa, and niacin.  Have kidney disease or psoriasis.  What are the signs or symptoms? An  attack of acute gout happens quickly. It usually occurs in just one joint. The most common place is the big toe. Attacks often start at night. Other joints that may be affected include joints of the feet, ankle, knee, fingers, wrist, or elbow. Symptoms may include:  Severe pain.  Warmth.  Swelling.  Stiffness.  Tenderness. The affected joint may be very painful to touch.  Shiny, red, or purple skin.  Chills and fever.  Chronic gout may cause symptoms more frequently. More joints may be involved. You may also have white or yellow lumps (tophi) on your hands or feet or in other areas near your joints. How is this diagnosed? This condition is diagnosed based on your symptoms, medical history, and physical exam. You may have tests, such as:  Blood tests to measure uric acid levels.  Removal of joint fluid with a needle (aspiration) to look for uric acid crystals.  X-rays to look for joint damage.  How is this treated? Treatment for this condition has two phases: treating an acute attack and preventing future attacks. Acute gout treatment may include medicines to reduce pain and swelling, including:  NSAIDs.  Steroids. These are strong anti-inflammatory medicines that can be taken by mouth (orally) or injected into a joint.  Colchicine. This medicine relieves pain and swelling when it is taken soon after an attack. It can be given orally or through an IV tube.  Preventive treatment may include:  Daily use of smaller doses of NSAIDs or colchicine.  Use of a medicine that reduces uric acid levels in your blood.  Changes to your diet. You may need to see a specialist about healthy eating (dietitian).  Follow these instructions at home: During a Gout Attack  If directed, apply ice to the affected area: ? Put ice in a plastic bag. ? Place a towel between your skin and the bag. ? Leave the ice on for 20 minutes, 2-3 times a day.  Rest the joint as much as possible. If the  affected joint is in your leg, you may be given crutches to use.  Raise (elevate) the affected joint above the level of your heart as often as possible.  Drink enough fluids to keep your urine clear or pale yellow.  Take over-the-counter and prescription medicines only as told by your health care provider.  Do not drive or operate heavy machinery while taking prescription pain medicine.  Follow instructions from your health care provider about eating or drinking restrictions.  Return to your normal activities as told by your health care provider. Ask your health care provider what activities are safe for you. Avoiding Future Gout Attacks  Follow a low-purine diet as told by your dietitian or health care provider. Avoid foods and drinks that are high in purines, including liver, kidney, anchovies, asparagus, herring, mushrooms, mussels, and beer.  Limit alcohol intake to no more than 1 drink a day for nonpregnant women and 2 drinks a day for men. One drink equals 12 oz of beer, 5 oz of wine, or 1 oz of hard liquor.  Maintain a healthy weight or lose weight if you are overweight. If you want to lose weight, talk with your health care provider. It is important that you do not lose weight too quickly.  Start or maintain an exercise program as told by your health care provider.  Drink enough fluids to keep your urine clear or pale yellow.  Take over-the-counter and prescription medicines only as told by your health care provider.  Keep all follow-up visits as told by your health care provider. This is important. Contact a health care provider if:  You have another gout attack.  You continue to have symptoms of a gout attack after10 days of treatment.  You have side effects from your medicines.  You have chills or a fever.  You have burning pain when you urinate.  You have pain in your lower back or belly. Get help right away if:  You have severe or uncontrolled pain.  You  cannot urinate. This information is not intended to replace advice given to you by your health care provider. Make sure you discuss any questions you have with your health care provider. Document Released: 03/27/2000 Document Revised: 09/05/2015 Document Reviewed: 01/10/2015 Elsevier Interactive Patient Education  2017 [employer]

## 2017-01-14 ENCOUNTER — Ambulatory Visit: Payer: Self-pay | Admitting: Neurology

## 2017-06-02 ENCOUNTER — Ambulatory Visit: Payer: BLUE CROSS/BLUE SHIELD | Admitting: Internal Medicine

## 2017-06-02 ENCOUNTER — Encounter: Payer: Self-pay | Admitting: Internal Medicine

## 2017-06-02 VITALS — BP 126/68 | HR 79 | Temp 97.9°F | Resp 14 | Ht 70.0 in | Wt 295.0 lb

## 2017-06-02 DIAGNOSIS — M109 Gout, unspecified: Secondary | ICD-10-CM

## 2017-06-02 DIAGNOSIS — G4733 Obstructive sleep apnea (adult) (pediatric): Secondary | ICD-10-CM | POA: Diagnosis not present

## 2017-06-02 MED ORDER — INDOMETHACIN 50 MG PO CAPS: 50.0000 mg | ORAL_CAPSULE | Freq: Three times a day (TID) | ORAL | 0 refills | Status: DC | PRN

## 2017-06-02 NOTE — Progress Notes (Signed)
Pre visit review using our clinic review tool, if applicable. No additional management support is needed unless otherwise documented below in the visit note. 

## 2017-06-02 NOTE — Progress Notes (Signed)
Subjective:    Patient ID: Tyler Doyle, male    DOB: 06-22-1975, 42 y.o.   MRN: [id number]  DOS:  06/02/2017 Type of visit - description : acute Interval history: Symptoms of started 2 days ago: Left knee felt stiff, range of motion difficult.  he had no injury, swelling or redness. Taking Indocid as needed, slightly better today.  Also, this morning felt like the left ankle was sharply painful, thinks is gout. No swelling or redness.   Review of Systems No fever chills  Past Medical History:  Diagnosis Date  . Anxiety and depression 05/13/2009   Qualifier: Diagnosis of  By: Drue NovelPaz MD, Nolon RodJose E.   . Gout 05/13/2009   Qualifier: Diagnosis of  By: Drue NovelPaz MD, Nolon RodJose E.     Past Surgical History:  Procedure Laterality Date  . APPENDECTOMY    . CHOLECYSTECTOMY    . THROAT SURGERY     vocal cord polyp    Social History   Socioeconomic History  . Marital status: Married    Spouse name: Not on file  . Number of children: 2  . Years of education: Not on file  . Highest education level: Not on file  Social Needs  . Financial resource strain: Not on file  . Food insecurity - worry: Not on file  . Food insecurity - inability: Not on file  . Transportation needs - medical: Not on file  . Transportation needs - non-medical: Not on file  Occupational History  . Occupation: musician  Tobacco Use  . Smoking status: Never Smoker  . Smokeless tobacco: Never Used  Substance and Sexual Activity  . Alcohol use: Yes    Alcohol/week: 0.0 oz  . Drug use: No  . Sexual activity: Not on file  Other Topics Concern  . Not on file  Social History Narrative   Separated from wife   [place] from [place]      Allergies as of 06/02/2017      Reactions   Sulfa Antibiotics    Patient states " Felt as if I was going crazy"       Medication List        Accurate as of 06/02/17  8:48 AM. Always use your most recent med list.          indomethacin 50 MG capsule Commonly known as:   INDOCIN Take 1 capsule (50 mg total) by mouth 3 (three) times daily with meals.   ketoconazole 2 % cream Commonly known as:  NIZORAL Apply 1 application topically daily.          Objective:   Physical Exam  Musculoskeletal:       Legs:  BP 126/68 (BP Location: Left Arm, Patient Position: Sitting, Cuff Size: Normal)   Pulse 79   Temp 97.9 F (36.6 C) (Oral)   Resp 14   Ht 5\' 10"  (1.778 m)   Wt 295 lb (133.8 kg)   SpO2 96%   BMI 42.33 kg/m  General:   Well developed, well nourished . NAD.  HEENT:  Normocephalic . Face symmetric, atraumatic MSK Right knee normal Left knee: See graphic Left ankle: Normal to inspection, palpation, symmetric except for mild warmness compared to the left. Skin: Not pale. Not jaundice Neurologic:  alert & oriented X3.  Speech normal, gait appropriate for age and unassisted Psych--  Cognition and judgment appear intact.  Cooperative with normal attention span and concentration.  Behavior appropriate. No anxious or depressed appearing.  Assessment & Plan:   Assessment  Anxiety, depression, insomnia --used to see Dr. Tomasa Rand, started to get prescriptions here 12-2014, on prozac/sonata prn; self d/c prozac 2017 ED d/t  Prozac on Viagra prn Gout: dx in the 90s, usually one episode a year Morbid obesity OSA, sleep study 09/2016 [facility] 19.4/hour, RX CPAP  PLAN: Left knee and ankle pain. Physical exam  is benign, resolving gout left knee and ankle?Marland Kitchen For now recommend ice, Indocin, call if not improving.   OSA: Reports good use of CPAP, feeling subjectively better.

## 2017-06-02 NOTE — Patient Instructions (Signed)
Take indocin as needed  Ice to the knee and ankle at night as needed  Call if not gradually better

## 2017-06-02 NOTE — Assessment & Plan Note (Signed)
Left knee and ankle pain. Physical exam  is benign, resolving gout left knee and ankle?Tyler Doyle. For now recommend ice, Indocin, call if not improving.   OSA: Reports good use of CPAP, feeling subjectively better.

## 2018-01-06 DIAGNOSIS — M5412 Radiculopathy, cervical region: Secondary | ICD-10-CM | POA: Diagnosis not present

## 2018-01-06 DIAGNOSIS — M9903 Segmental and somatic dysfunction of lumbar region: Secondary | ICD-10-CM | POA: Diagnosis not present

## 2018-01-06 DIAGNOSIS — M531 Cervicobrachial syndrome: Secondary | ICD-10-CM | POA: Diagnosis not present

## 2018-01-06 DIAGNOSIS — M9901 Segmental and somatic dysfunction of cervical region: Secondary | ICD-10-CM | POA: Diagnosis not present

## 2018-01-10 DIAGNOSIS — M531 Cervicobrachial syndrome: Secondary | ICD-10-CM | POA: Diagnosis not present

## 2018-01-10 DIAGNOSIS — M9901 Segmental and somatic dysfunction of cervical region: Secondary | ICD-10-CM | POA: Diagnosis not present

## 2018-01-10 DIAGNOSIS — M5412 Radiculopathy, cervical region: Secondary | ICD-10-CM | POA: Diagnosis not present

## 2018-01-10 DIAGNOSIS — M9903 Segmental and somatic dysfunction of lumbar region: Secondary | ICD-10-CM | POA: Diagnosis not present

## 2018-01-13 ENCOUNTER — Telehealth: Payer: Self-pay | Admitting: Neurology

## 2018-01-13 NOTE — Telephone Encounter (Addendum)
*  FYI only *Pt was called and informed that an appointment is needed because of when he was last seen in 2018.  Pt was informed he would need to schedule an office visit but would 1st need to speak with billing re: his having a balance.  Pt agreed to be connected to billing to take care of that.  It was explained that once his balance has been taken care of an office visit could then be scheduled with Dr Vickey Huger.

## 2018-01-13 NOTE — Telephone Encounter (Signed)
Tyler Doyle- please call patient back. We cannot give any orders for sleep stuff unless seen within the last year. He has not been seen since 2018. Please call him back and schedule him an appt. Thank you!

## 2018-01-13 NOTE — Telephone Encounter (Signed)
Pt has called informing that his mask is broken, when he called the company it came from he was told he has to be in compliance for at least 20 days before they will replace the mask.  Pt states he is unable to be compliant when the mask is not working for him.  Pt is asking for a call for help with the situation at hand

## 2018-01-13 NOTE — Telephone Encounter (Signed)
Noted  

## 2018-01-20 DIAGNOSIS — M5412 Radiculopathy, cervical region: Secondary | ICD-10-CM | POA: Diagnosis not present

## 2018-01-20 DIAGNOSIS — M9903 Segmental and somatic dysfunction of lumbar region: Secondary | ICD-10-CM | POA: Diagnosis not present

## 2018-01-20 DIAGNOSIS — M9901 Segmental and somatic dysfunction of cervical region: Secondary | ICD-10-CM | POA: Diagnosis not present

## 2018-01-20 DIAGNOSIS — M531 Cervicobrachial syndrome: Secondary | ICD-10-CM | POA: Diagnosis not present

## 2018-01-27 DIAGNOSIS — M531 Cervicobrachial syndrome: Secondary | ICD-10-CM | POA: Diagnosis not present

## 2018-01-27 DIAGNOSIS — M5412 Radiculopathy, cervical region: Secondary | ICD-10-CM | POA: Diagnosis not present

## 2018-01-27 DIAGNOSIS — M9901 Segmental and somatic dysfunction of cervical region: Secondary | ICD-10-CM | POA: Diagnosis not present

## 2018-01-27 DIAGNOSIS — M9903 Segmental and somatic dysfunction of lumbar region: Secondary | ICD-10-CM | POA: Diagnosis not present

## 2018-02-03 DIAGNOSIS — M5412 Radiculopathy, cervical region: Secondary | ICD-10-CM | POA: Diagnosis not present

## 2018-02-03 DIAGNOSIS — M531 Cervicobrachial syndrome: Secondary | ICD-10-CM | POA: Diagnosis not present

## 2018-02-03 DIAGNOSIS — M9903 Segmental and somatic dysfunction of lumbar region: Secondary | ICD-10-CM | POA: Diagnosis not present

## 2018-02-03 DIAGNOSIS — M9901 Segmental and somatic dysfunction of cervical region: Secondary | ICD-10-CM | POA: Diagnosis not present

## 2018-02-10 DIAGNOSIS — M5412 Radiculopathy, cervical region: Secondary | ICD-10-CM | POA: Diagnosis not present

## 2018-02-10 DIAGNOSIS — M9903 Segmental and somatic dysfunction of lumbar region: Secondary | ICD-10-CM | POA: Diagnosis not present

## 2018-02-10 DIAGNOSIS — M531 Cervicobrachial syndrome: Secondary | ICD-10-CM | POA: Diagnosis not present

## 2018-02-10 DIAGNOSIS — M9901 Segmental and somatic dysfunction of cervical region: Secondary | ICD-10-CM | POA: Diagnosis not present

## 2018-02-17 DIAGNOSIS — M9903 Segmental and somatic dysfunction of lumbar region: Secondary | ICD-10-CM | POA: Diagnosis not present

## 2018-02-17 DIAGNOSIS — M531 Cervicobrachial syndrome: Secondary | ICD-10-CM | POA: Diagnosis not present

## 2018-02-17 DIAGNOSIS — M5412 Radiculopathy, cervical region: Secondary | ICD-10-CM | POA: Diagnosis not present

## 2018-02-17 DIAGNOSIS — M9901 Segmental and somatic dysfunction of cervical region: Secondary | ICD-10-CM | POA: Diagnosis not present

## 2018-03-02 DIAGNOSIS — G4733 Obstructive sleep apnea (adult) (pediatric): Secondary | ICD-10-CM | POA: Diagnosis not present

## 2018-03-03 DIAGNOSIS — M9901 Segmental and somatic dysfunction of cervical region: Secondary | ICD-10-CM | POA: Diagnosis not present

## 2018-03-03 DIAGNOSIS — M531 Cervicobrachial syndrome: Secondary | ICD-10-CM | POA: Diagnosis not present

## 2018-03-03 DIAGNOSIS — M5412 Radiculopathy, cervical region: Secondary | ICD-10-CM | POA: Diagnosis not present

## 2018-03-03 DIAGNOSIS — M9903 Segmental and somatic dysfunction of lumbar region: Secondary | ICD-10-CM | POA: Diagnosis not present

## 2018-06-27 ENCOUNTER — Telehealth: Payer: Self-pay | Admitting: Internal Medicine

## 2018-06-27 DIAGNOSIS — M109 Gout, unspecified: Secondary | ICD-10-CM

## 2018-06-27 NOTE — Telephone Encounter (Signed)
Last OV over 1 year ago. Will need to be seen.

## 2018-06-27 NOTE — Telephone Encounter (Signed)
Copied from CRM [id number]. Topic: Quick Communication - Rx Refill/Question >> Jun 27, 2018  9:08 AM Lynne Logan D wrote: Medication: indomethacin (INDOCIN) 50 MG capsule / Pt does not want to come into the office due to recommendations of staying away from public places however he was informed that he may have to schedule OV due to last OV being a year ago. Pt expressed understanding. Please advise pt.  Has the patient contacted their pharmacy? No. (Agent: If no, request that the patient contact the pharmacy for the refill.) (Agent: If yes, when and what did the pharmacy advise?)  Preferred Pharmacy (with phone number or street name): [id number] - [place], [place] - [address] AT [facility] [address] [phone] (Phone) [phone] (Fax)    Agent: Please be advised that RX refills may take up to 3 business days. We ask that you follow-up with your pharmacy.

## 2018-06-28 NOTE — Telephone Encounter (Signed)
Schedule pt on 07-12-2018 since pt was worried about the COVID 19, pt decided to get an appt for March 31. Done

## 2018-07-12 ENCOUNTER — Other Ambulatory Visit: Payer: Self-pay

## 2018-07-12 ENCOUNTER — Ambulatory Visit (INDEPENDENT_AMBULATORY_CARE_PROVIDER_SITE_OTHER): Payer: BLUE CROSS/BLUE SHIELD | Admitting: Internal Medicine

## 2018-07-12 ENCOUNTER — Encounter: Payer: Self-pay | Admitting: Internal Medicine

## 2018-07-12 DIAGNOSIS — F32A Depression, unspecified: Secondary | ICD-10-CM

## 2018-07-12 DIAGNOSIS — F419 Anxiety disorder, unspecified: Secondary | ICD-10-CM

## 2018-07-12 DIAGNOSIS — G4700 Insomnia, unspecified: Secondary | ICD-10-CM | POA: Diagnosis not present

## 2018-07-12 DIAGNOSIS — M109 Gout, unspecified: Secondary | ICD-10-CM

## 2018-07-12 DIAGNOSIS — F329 Major depressive disorder, single episode, unspecified: Secondary | ICD-10-CM

## 2018-07-12 MED ORDER — INDOMETHACIN 50 MG PO CAPS: 50.0000 mg | ORAL_CAPSULE | Freq: Three times a day (TID) | ORAL | 0 refills | Status: DC | PRN

## 2018-07-12 MED ORDER — ALPRAZOLAM 0.5 MG PO TABS: 0.5000 mg | ORAL_TABLET | Freq: Every evening | ORAL | 0 refills | Status: DC | PRN

## 2018-07-12 MED ORDER — COLCHICINE 0.6 MG PO CAPS: 1.0000 | ORAL_CAPSULE | Freq: Two times a day (BID) | ORAL | 0 refills | Status: DC | PRN

## 2018-07-12 MED ORDER — PREDNISONE 10 MG PO TABS: ORAL_TABLET | ORAL | 0 refills | Status: DC

## 2018-07-12 MED ORDER — ESCITALOPRAM OXALATE 10 MG PO TABS: 10.0000 mg | ORAL_TABLET | Freq: Every day | ORAL | 1 refills | Status: DC

## 2018-07-12 NOTE — Progress Notes (Signed)
Subjective:    Patient ID: Tyler Doyle, male    DOB: Feb 22, 1976, 43 y.o.   MRN: [id number]  DOS:  07/12/2018 Type of visit - description: Virtual Visit via Video Note  I connected with@ on 07/12/18 at  8:40 AM EDT by a video enabled telemedicine application and verified that I am speaking with the correct person using two identifiers.   THIS ENCOUNTER IS A VIRTUAL VISIT DUE TO COVID-19 - PATIENT WAS NOT SEEN IN THE OFFICE. PATIENT HAS CONSENTED TO VIRTUAL VISIT / TELEMEDICINE VISIT   Location of patient: home  Location of provider: office  I discussed the limitations of evaluation and management by telemedicine and the availability of in person appointments. The patient expressed understanding and agreed to proceed.  History of Present Illness: We discussed several issues: History of gout, having problems for the last 2 weeks, Indocin helps to some extent but he still has pain and swelling at the base of the great toes.  History of anxiety and depression, off SSRIs for couple of years, symptoms have returned, he is obviously anxious about the coronavirus situation. Would like to restart her SSRI. Having also difficulty sleeping, a friend  Provided a low-dose of Xanax and that helped very well.    Review of Systems Denies fever chills No cough or difficulty breathing No suicidal ideas No nausea, vomiting, diarrhea.  Past Medical History:  Diagnosis Date  . Anxiety and depression 05/13/2009   Qualifier: Diagnosis of  By: Drue Novel MD, Nolon Rod.   . Gout 05/13/2009   Qualifier: Diagnosis of  By: Drue Novel MD, Nolon Rod.     Past Surgical History:  Procedure Laterality Date  . APPENDECTOMY    . CHOLECYSTECTOMY    . THROAT SURGERY     vocal cord polyp    Social History   Socioeconomic History  . Marital status: Married    Spouse name: Not on file  . Number of children: 2  . Years of education: Not on file  . Highest education level: Not on file  Occupational History  .  Occupation: musician  Social Needs  . Financial resource strain: Not on file  . Food insecurity:    Worry: Not on file    Inability: Not on file  . Transportation needs:    Medical: Not on file    Non-medical: Not on file  Tobacco Use  . Smoking status: Never Smoker  . Smokeless tobacco: Never Used  Substance and Sexual Activity  . Alcohol use: Yes    Alcohol/week: 0.0 standard drinks  . Drug use: No  . Sexual activity: Not on file  Lifestyle  . Physical activity:    Days per week: Not on file    Minutes per session: Not on file  . Stress: Not on file  Relationships  . Social connections:    Talks on phone: Not on file    Gets together: Not on file    Attends religious service: Not on file    Active member of club or organization: Not on file    Attends meetings of clubs or organizations: Not on file    Relationship status: Not on file  . Intimate partner violence:    Fear of current or ex partner: Not on file    Emotionally abused: Not on file    Physically abused: Not on file    Forced sexual activity: Not on file  Other Topics Concern  . Not on file  Social History Narrative  Separated from wife   Original from [place]      Allergies as of 07/12/2018      Reactions   Sulfa Antibiotics    Patient states " Felt as if I was going crazy"       Medication List       Accurate as of July 12, 2018  8:27 AM. Always use your most recent med list.        indomethacin 50 MG capsule Commonly known as:  INDOCIN Take 1 capsule (50 mg total) by mouth 3 (three) times daily as needed.           Objective:   Physical Exam There were no vitals taken for this visit. This was a video conference, he was in no distress.    Assessment      Assessment  Anxiety, depression, insomnia --used to see Dr. Tomasa Rand, started to get prescriptions here 12-2014, on prozac/sonata prn; self d/c prozac 2017 ED d/t  Prozac on Viagra prn Gout: dx in the 90s, usually one  episode a year Morbid obesity OSA, sleep study 09/2016 [facility] 19.4/hour, RX CPAP  PLAN: Gout: Symptoms have returned, he is taking Indocin with partial resolution of symptoms. Plan: Colchicine twice a day until better Continue indometacin, GI precautions discussed Round of prednisone see prescription Watch diet, will mail a gout diet. Call if not improving Anxiety, depression, insomnia: Has been well until recently, very stressed about the coronavirus situation. Prozac in the past helped but he had sexual side effects.  We agreed to try a different SSRI, send a prescription for Lexapro.  Also will send small amount of Xanax, to help insomnia.  I anticipate insomnia will get better once anxiety improves. RTC: We will schedule a follow-up conference in 3 to 4 weeks.  I discussed the assessment and treatment plan with the patient. The patient was provided an opportunity to ask questions and all were answered. The patient agreed with the plan and demonstrated an understanding of the instructions.   The patient was advised to call back or seek an in-person evaluation if the symptoms worsen or if the condition fails to improve as anticipated.   Today, I spent more than 25   min with the patient: >50% of the time counseling regards gout mngmt; also anxiety (counsled, provided listening therapy)

## 2018-07-13 DIAGNOSIS — G4700 Insomnia, unspecified: Secondary | ICD-10-CM | POA: Insufficient documentation

## 2018-07-13 NOTE — Assessment & Plan Note (Signed)
Gout: Symptoms have returned, he is taking Indocin with partial resolution of symptoms. Plan: Colchicine twice a day until better Continue indometacin, GI precautions discussed Round of prednisone see prescription Watch diet, will mail a gout diet. Call if not improving Anxiety, depression, insomnia: Has been well until recently, very stressed about the coronavirus situation. Prozac in the past helped but he had sexual side effects.  We agreed to try a different SSRI, send a prescription for Lexapro.  Also will send small amount of Xanax, to help insomnia.  I anticipate insomnia will get better once anxiety improves. RTC: We will schedule a follow-up conference in 3 to 4 weeks.

## 2018-08-09 ENCOUNTER — Other Ambulatory Visit: Payer: Self-pay

## 2018-08-09 ENCOUNTER — Ambulatory Visit (INDEPENDENT_AMBULATORY_CARE_PROVIDER_SITE_OTHER): Payer: BLUE CROSS/BLUE SHIELD | Admitting: Internal Medicine

## 2018-08-09 DIAGNOSIS — F329 Major depressive disorder, single episode, unspecified: Secondary | ICD-10-CM

## 2018-08-09 DIAGNOSIS — G4700 Insomnia, unspecified: Secondary | ICD-10-CM | POA: Diagnosis not present

## 2018-08-09 DIAGNOSIS — F419 Anxiety disorder, unspecified: Secondary | ICD-10-CM

## 2018-08-09 DIAGNOSIS — F32A Depression, unspecified: Secondary | ICD-10-CM

## 2018-08-09 DIAGNOSIS — M109 Gout, unspecified: Secondary | ICD-10-CM | POA: Diagnosis not present

## 2018-08-09 NOTE — Progress Notes (Signed)
Subjective:    Patient ID: Tyler Doyle, male    DOB: [birth year], 43 y.o.   MRN: [id number]  DOS:  08/09/2018 Type of visit - description: Virtual Visit via Video Note  I connected with@ on 08/10/18 at  8:20 AM EDT by a video enabled telemedicine application and verified that I am speaking with the correct person using two identifiers.   THIS ENCOUNTER IS A VIRTUAL VISIT DUE TO COVID-19 - PATIENT WAS NOT SEEN IN THE OFFICE. PATIENT HAS CONSENTED TO VIRTUAL VISIT / TELEMEDICINE VISIT   Location of patient: home  Location of provider: office  I discussed the limitations of evaluation and management by telemedicine and the availability of in person appointments. The patient expressed understanding and agreed to proceed.  History of Present Illness: Follow-up from previous visit Gout: Currently doing well, asymptomatic, on meds as needed Anxiety: Improved after a few doses of Lexapro, self DC SSRI, does not like to take long-term, worried about side effects.   Review of Systems Other than above he is doing well. No fever, chills. Good COVID-19 precautions. No cough  Past Medical History:  Diagnosis Date  . Anxiety and depression 05/13/2009   Qualifier: Diagnosis of  By: Drue Novel MD, Nolon Rod.   . Gout 05/13/2009   Qualifier: Diagnosis of  By: Drue Novel MD, Nolon Rod.     Past Surgical History:  Procedure Laterality Date  . APPENDECTOMY    . CHOLECYSTECTOMY    . THROAT SURGERY     vocal cord polyp    Social History   Socioeconomic History  . Marital status: Married    Spouse name: Not on file  . Number of children: 2  . Years of education: Not on file  . Highest education level: Not on file  Occupational History  . Occupation: musician  Social Needs  . Financial resource strain: Not on file  . Food insecurity:    Worry: Not on file    Inability: Not on file  . Transportation needs:    Medical: Not on file    Non-medical: Not on file  Tobacco Use  . Smoking status: Never  Smoker  . Smokeless tobacco: Never Used  Substance and Sexual Activity  . Alcohol use: Yes    Alcohol/week: 0.0 standard drinks  . Drug use: No  . Sexual activity: Not on file  Lifestyle  . Physical activity:    Days per week: Not on file    Minutes per session: Not on file  . Stress: Not on file  Relationships  . Social connections:    Talks on phone: Not on file    Gets together: Not on file    Attends religious service: Not on file    Active member of club or organization: Not on file    Attends meetings of clubs or organizations: Not on file    Relationship status: Not on file  . Intimate partner violence:    Fear of current or ex partner: Not on file    Emotionally abused: Not on file    Physically abused: Not on file    Forced sexual activity: Not on file  Other Topics Concern  . Not on file  Social History Narrative   Separated from wife   Share custody 2 daughters   Original from [place]      Allergies as of 08/09/2018      Reactions   Sulfa Antibiotics    Patient states " Felt as if I was going  crazy"       Medication List       Accurate as of August 09, 2018 11:59 PM. Always use your most recent med list.        ALPRAZolam 0.5 MG tablet Commonly known as:  Xanax Take 1 tablet (0.5 mg total) by mouth at bedtime as needed for anxiety.   Colchicine 0.6 MG Caps Commonly known as:  Mitigare Take 1 capsule by mouth 2 (two) times daily as needed.   escitalopram 10 MG tablet Commonly known as:  LEXAPRO Take 1 tablet (10 mg total) by mouth daily.   indomethacin 50 MG capsule Commonly known as:  INDOCIN Take 1 capsule (50 mg total) by mouth 3 (three) times daily as needed.           Objective:   Physical Exam There were no vitals taken for this visit. This is a video conference, alert oriented x3, no apparent distress.    Assessment     Assessment  Anxiety, depression, insomnia --used to see Dr. Tomasa Randunningham, started to get prescriptions here  12-2014, on prozac/sonata prn; self d/c prozac 2017 ED d/t  Prozac on Viagra prn Gout: dx in the 90s, usually one episode a year Morbid obesity OSA, sleep study 09/2016 [facility] 19.4/hour, RX CPAP  PLAN: Gout: Since the last time, he took prednisone, currently feeling much better, on colchicine and Indocin as needed, has not needed them lately. Anxiety, depression, insomnia: He took Lexapro, for couple of days, he felt better, does not like to take SSRIs long-term.  Reports he is keeping Lexapro to take on "as needed" basis.  Same with Xanax, plans to take it as needed for insomnia.  Currently doing well without any medications. COVID-19: Multiple concerns, they were discussed with him, explaining what to do in case of a mild or more severe case.  He had questions about medicines, I explained patient this is a rapidly changing field. RTC 4 months, CPX, will call and schedule.     I discussed the assessment and treatment plan with the patient. The patient was provided an opportunity to ask questions and all were answered. The patient agreed with the plan and demonstrated an understanding of the instructions.   The patient was advised to call back or seek an in-person evaluation if the symptoms worsen or if the condition fails to improve as anticipated.

## 2018-08-10 NOTE — Assessment & Plan Note (Signed)
Gout: Since the last time, he took prednisone, currently feeling much better, on colchicine and Indocin as needed, has not needed them lately. Anxiety, depression, insomnia: He took Lexapro, for couple of days, he felt better, does not like to take SSRIs long-term.  Reports he is keeping Lexapro to take on "as needed" basis.  Same with Xanax, plans to take it as needed for insomnia.  Currently doing well without any medications. COVID-19: Multiple concerns, they were discussed with him, explaining what to do in case of a mild or more severe case.  He had questions about medicines, I explained patient this is a rapidly changing field. RTC 4 months, CPX, will call and schedule.

## 2018-11-29 DIAGNOSIS — Z20828 Contact with and (suspected) exposure to other viral communicable diseases: Secondary | ICD-10-CM | POA: Diagnosis not present

## 2018-12-01 ENCOUNTER — Encounter: Payer: Self-pay | Admitting: Internal Medicine

## 2018-12-02 ENCOUNTER — Ambulatory Visit (INDEPENDENT_AMBULATORY_CARE_PROVIDER_SITE_OTHER): Payer: BC Managed Care – PPO | Admitting: Family Medicine

## 2018-12-02 DIAGNOSIS — J40 Bronchitis, not specified as acute or chronic: Secondary | ICD-10-CM | POA: Diagnosis not present

## 2018-12-02 MED ORDER — AMOXICILLIN-POT CLAVULANATE 875-125 MG PO TABS: 1.0000 | ORAL_TABLET | Freq: Two times a day (BID) | ORAL | 0 refills | Status: DC

## 2018-12-02 NOTE — Progress Notes (Signed)
Virtual Visit via Video Note  I connected with Tyler Doyle on 12/04/18 at  2:40 PM EDT by a video enabled telemedicine application and verified that I am speaking with the correct person using two identifiers.  Location: Patient: home Provider: office    I discussed the limitations of evaluation and management by telemedicine and the availability of in person appointments. The patient expressed understanding and agreed to proceed.  History of Present Illness: Pt is home c/o cough no fever , productive  Pt has had a covid test that was neg.   symptoms x few weeks -- otc not working  Observations/Objective: No vitals obtained Pt in NAD  Assessment and Plan: 1. Bronchitis Ok to use otc cough meds abx per orders and call prn  - amoxicillin-clavulanate (AUGMENTIN) 875-125 MG tablet; Take 1 tablet by mouth 2 (two) times daily.  Dispense: 20 tablet; Refill: 0   Follow Up Instructions:    I discussed the assessment and treatment plan with the patient. The patient was provided an opportunity to ask questions and all were answered. The patient ag15reed with the plan and demonstrated an understanding of the instructions.   The patient was advised to call back or seek an in-person evaluation if the symptoms worsen or if the condition fails to improve as anticipated.  I provided 15 minutes of non-face-to-face time during this encounter.   Ann Held, DO

## 2018-12-04 ENCOUNTER — Encounter: Payer: Self-pay | Admitting: Family Medicine

## 2018-12-14 ENCOUNTER — Encounter: Payer: Self-pay | Admitting: Internal Medicine

## 2018-12-14 ENCOUNTER — Other Ambulatory Visit: Payer: Self-pay

## 2018-12-14 ENCOUNTER — Ambulatory Visit (INDEPENDENT_AMBULATORY_CARE_PROVIDER_SITE_OTHER): Payer: BC Managed Care – PPO | Admitting: Internal Medicine

## 2018-12-14 DIAGNOSIS — R05 Cough: Secondary | ICD-10-CM | POA: Diagnosis not present

## 2018-12-14 DIAGNOSIS — R059 Cough, unspecified: Secondary | ICD-10-CM

## 2018-12-14 MED ORDER — AZITHROMYCIN 250 MG PO TABS: ORAL_TABLET | ORAL | 0 refills | Status: DC

## 2018-12-14 NOTE — Progress Notes (Signed)
Subjective:    Patient ID: Tyler Doyle, male    DOB: [birth year], 43 y.o.   MRN: [id number]  DOS:  12/14/2018 Type of visit - description: Virtual Visit via Video Note  I connected with@   by a video enabled telemedicine application and verified that I am speaking with the correct person using two identifiers.   THIS ENCOUNTER IS A VIRTUAL VISIT DUE TO COVID-19 - PATIENT WAS NOT SEEN IN THE OFFICE. PATIENT HAS CONSENTED TO VIRTUAL VISIT / TELEMEDICINE VISIT   Location of patient: home  Location of provider: office  I discussed the limitations of evaluation and management by telemedicine and the availability of in person appointments. The patient expressed understanding and agreed to proceed.  History of Present Illness:   Acute visit The patient had a respiratory sxs 2 weeks ago, had a fever for few days, coronavirus testing was negative. Was seen virtually at this office 12/02/2018, was prescribed Augmentin.  He never use the antibiotics because he felt he was gradually getting better. He calls today because he has persistent cough and mucus accumulation in the throat. He took Zyrtec-D for several days and it helped but he has run out of Zyrtec D.   Review of Systems  Denies fever chills at the present time No nausea, vomiting, diarrhea Had sinus pain and postnasal dripping: That is better No chest pain no difficulty breathing  Past Medical History:  Diagnosis Date  . Anxiety and depression 05/13/2009   Qualifier: Diagnosis of  By: Larose Kells MD, [place] Gout 05/13/2009   Qualifier: Diagnosis of  By: Larose Kells MD, [place]     Past Surgical History:  Procedure Laterality Date  . APPENDECTOMY    . CHOLECYSTECTOMY    . THROAT SURGERY     vocal cord polyp    Social History   Socioeconomic History  . Marital status: Married    Spouse name: Not on file  . Number of children: 2  . Years of education: Not on file  . Highest education level: Not on file  Occupational History   . Occupation: musician  Social Needs  . Financial resource strain: Not on file  . Food insecurity    Worry: Not on file    Inability: Not on file  . Transportation needs    Medical: Not on file    Non-medical: Not on file  Tobacco Use  . Smoking status: Never Smoker  . Smokeless tobacco: Never Used  Substance and Sexual Activity  . Alcohol use: Yes    Alcohol/week: 0.0 standard drinks  . Drug use: No  . Sexual activity: Not on file  Lifestyle  . Physical activity    Days per week: Not on file    Minutes per session: Not on file  . Stress: Not on file  Relationships  . Social Herbalist on phone: Not on file    Gets together: Not on file    Attends religious service: Not on file    Active member of club or organization: Not on file    Attends meetings of clubs or organizations: Not on file    Relationship status: Not on file  . Intimate partner violence    Fear of current or ex partner: Not on file    Emotionally abused: Not on file    Physically abused: Not on file    Forced sexual activity: Not on file  Other Topics Concern  . Not on file  Social History Narrative   Separated from wife   Share custody 2 daughters   [place]      Allergies as of 12/14/2018      Reactions   Sulfa Antibiotics    Patient states " Felt as if I was going crazy"       Medication List       Accurate as of December 14, 2018  5:31 PM. If you have any questions, ask your nurse or doctor.        STOP taking these medications   amoxicillin-clavulanate 875-125 MG tablet Commonly known as: Augmentin Stopped by: Willow OraJose Loran Fleet, MD     TAKE these medications   ALPRAZolam 0.5 MG tablet Commonly known as: Xanax Take 1 tablet (0.5 mg total) by mouth at bedtime as needed for anxiety.   azithromycin 250 MG tablet Commonly known as: Zithromax Z-Pak 2 tabs a day the first day, then 1 tab a day x 4 days Started by: Willow OraJose Cosmo Tetreault, MD   Colchicine 0.6 MG Caps Commonly  known as: Mitigare Take 1 capsule by mouth 2 (two) times daily as needed.   escitalopram 10 MG tablet Commonly known as: LEXAPRO Take 1 tablet (10 mg total) by mouth daily.   indomethacin 50 MG capsule Commonly known as: INDOCIN Take 1 capsule (50 mg total) by mouth 3 (three) times daily as needed.           Objective:   Physical Exam There were no vitals taken for this visit. This is a virtual video visit.  He is alert oriented x3, no apparent distress.    Assessment    Assessment  Anxiety, depression, insomnia --used to see Dr. Tomasa Randunningham, started to get prescriptions here 12-2014, on prozac/sonata prn; self d/c prozac 2017 ED d/t  Prozac on Viagra prn Gout: dx in the 90s, usually one episode a year Morbid obesity OSA, sleep study 09/2016 [facility] 19.4/hour, RX CPAP  PLAN:  Cough: Persisting cough after respiratory infection 2 weeks ago. We will recommend to go back temporarily to Zyrtec-D, Robitussin-DM as needed and Flonase. If he is not improving in few days, will use Z-Pak which was sent to his pharmacy today. Send in the patient instructions via my chart.  I discussed the assessment and treatment plan with the patient. The patient was provided an opportunity to ask questions and all were answered. The patient agreed with the plan and demonstrated an understanding of the instructions.   The patient was advised to call back or seek an in-person evaluation if the symptoms worsen or if the condition fails to improve as anticipated.

## 2018-12-14 NOTE — Assessment & Plan Note (Signed)
Cough: Persisting cough after respiratory infection 2 weeks ago. We will recommend to go back temporarily to Zyrtec-D, Robitussin-DM as needed and Flonase. If he is not improving in few days, will use Z-Pak which was sent to his pharmacy today. Send in the patient instructions via my chart.

## 2019-03-27 ENCOUNTER — Encounter: Payer: Self-pay | Admitting: Internal Medicine

## 2019-06-18 DIAGNOSIS — Z20828 Contact with and (suspected) exposure to other viral communicable diseases: Secondary | ICD-10-CM | POA: Diagnosis not present

## 2019-06-18 DIAGNOSIS — Z03818 Encounter for observation for suspected exposure to other biological agents ruled out: Secondary | ICD-10-CM | POA: Diagnosis not present

## 2019-08-04 ENCOUNTER — Other Ambulatory Visit: Payer: Self-pay | Admitting: Internal Medicine

## 2019-08-04 ENCOUNTER — Other Ambulatory Visit: Payer: Self-pay

## 2019-08-04 ENCOUNTER — Encounter: Payer: Self-pay | Admitting: Internal Medicine

## 2019-08-04 ENCOUNTER — Telehealth: Payer: BC Managed Care – PPO | Admitting: Internal Medicine

## 2019-08-04 DIAGNOSIS — M109 Gout, unspecified: Secondary | ICD-10-CM

## 2019-08-04 MED ORDER — COLCHICINE 0.6 MG PO CAPS: 1.0000 | ORAL_CAPSULE | Freq: Two times a day (BID) | ORAL | 0 refills | Status: DC | PRN

## 2019-08-04 MED ORDER — PREDNISONE 10 MG PO TABS: ORAL_TABLET | ORAL | 0 refills | Status: DC

## 2019-08-04 MED ORDER — INDOMETHACIN 50 MG PO CAPS: 50.0000 mg | ORAL_CAPSULE | Freq: Three times a day (TID) | ORAL | 0 refills | Status: DC | PRN

## 2019-08-31 ENCOUNTER — Other Ambulatory Visit: Payer: Self-pay | Admitting: Internal Medicine

## 2019-08-31 DIAGNOSIS — M109 Gout, unspecified: Secondary | ICD-10-CM

## 2019-12-26 ENCOUNTER — Telehealth: Payer: Self-pay | Admitting: Internal Medicine

## 2019-12-26 NOTE — Telephone Encounter (Signed)
Patient called to set appt with Dr. Drue Novel, stated he has been dizzy for the past week. I transferred patient to triage and advised him to follow their directions.

## 2019-12-26 NOTE — Telephone Encounter (Signed)
Nurse Assessment Nurse: Elesa Hacker, RN, Nash Dimmer Date/Time Tyler Doyle Time): 12/26/2019 2:48:22 PM Confirm and document reason for call. If symptomatic, describe symptoms. ---Caller advised that he is having dizziness that started 10 days ago. Denies any other symptoms. Has the patient had close contact with a person known or suspected to have the novel coronavirus illness OR traveled / lives in area with major community spread (including international travel) in the last 14 days from the onset of symptoms? * If Asymptomatic, screen for exposure and travel within the last 14 days. ---No Does the patient have any new or worsening symptoms? ---Yes Will a triage be completed? ---Yes Related visit to physician within the last 2 weeks? ---No Does the PT have any chronic conditions? (i.e. diabetes, asthma, this includes High risk factors for pregnancy, etc.) ---No Is this a behavioral health or substance abuse call? ---No Guidelines Guideline Title Affirmed Question Affirmed Notes Nurse Date/Time (Eastern Time) Dizziness - Lightheadedness [1] MODERATE dizziness (e.g., interferes with normal activities) AND [2] has NOT been evaluated by physician for this (Exception: dizziness caused by heat exposure, sudden standing, or poor fluid intake) Deaton, RN, Nash Dimmer 12/26/2019 2:49:26 PM PLEASE NOTE: All timestamps contained within this report are represented as [place] Standard Time. CONFIDENTIALTY NOTICE: This fax transmission is intended only for the addressee. It contains information that is legally privileged, confidential or otherwise protected from use or disclosure. If you are not the intended recipient, you are strictly prohibited from reviewing, disclosing, copying using or disseminating any of this information or taking any action in reliance on or regarding this information. If you have received this fax in error, please notify us immediately by telephone so that we can arrange for its return  to [place]. Phone: [phone], Toll-Free: [phone], Fax: [phone] Page: 2 of 2 Call Id: [id number] Disp. Time Tyler Doyle Time) Disposition Final User 12/26/2019 2:27:09 PM Attempt made - no message left Deaton, RN, Nash Dimmer 12/26/2019 2:54:14 PM See PCP within 24 Hours Yes Deaton, RN, Cory Roughen Disagree/Comply Comply Caller Understands Yes PreDisposition Did not know what to do Care Advice Given Per Guideline CALL BACK IF: * Passes out (faints) * You become worse CARE ADVICE given per Dizziness (Adult) guideline. DRINK FLUIDS: * Drink several glasses of fruit juice, other clear fluids or water. * If the weather is hot or you have a fever, make sure the fluids are cold. SEE PCP WITHIN 24 HOURS: * IF OFFICE WILL BE OPEN: You need to be examined within the next 24 hours. Call your doctor (or NP/PA) when the office opens and make an appointment. Comments User: Wandra Scot, RN Date/Time Tyler Doyle Time): 12/26/2019 2:27:30 PM Unable to leave a message. Voice mail box full. User: Wandra Scot, RN Date/Time Tyler Doyle Time): 12/26/2019 2:54:56 PM Caller warm transferred and has an appt in the am at 10 with Dr. Drue Novel. Referrals REFERRED TO PCP OFFICE Warm transfer to backline

## 2019-12-27 ENCOUNTER — Ambulatory Visit: Payer: BC Managed Care – PPO | Admitting: Internal Medicine

## 2019-12-27 ENCOUNTER — Encounter: Payer: Self-pay | Admitting: Internal Medicine

## 2019-12-27 ENCOUNTER — Other Ambulatory Visit: Payer: Self-pay

## 2019-12-27 VITALS — BP 116/80 | HR 75 | Temp 98.0°F | Resp 18 | Ht 70.0 in | Wt 282.1 lb

## 2019-12-27 DIAGNOSIS — G4733 Obstructive sleep apnea (adult) (pediatric): Secondary | ICD-10-CM

## 2019-12-27 DIAGNOSIS — R42 Dizziness and giddiness: Secondary | ICD-10-CM | POA: Diagnosis not present

## 2019-12-27 NOTE — Progress Notes (Signed)
Pre visit review using our clinic review tool, if applicable. No additional management support is needed unless otherwise documented below in the visit note. 

## 2019-12-27 NOTE — Progress Notes (Signed)
Subjective:    Patient ID: Tyler Doyle, male    DOB: 24-Dec-1975, 44 y.o.   MRN: [id number]  DOS:  12/27/2019 Type of visit - description: Acute Developed dizziness approximately 2 weeks ago. Symptoms are on and off, described as imbalance, no associated with nausea. Sometimes  happen with certain head motions or when he is standing. Yesterday happened at rest and dizziness got intense.  Review of Systems Denies chest pain, difficulty breathing or palpitation. No abdominal pain no blood in the stools No headaches, diplopia, slurred speech or motor deficits.   Past Medical History:  Diagnosis Date  . Anxiety and depression 05/13/2009   Qualifier: Diagnosis of  By: Drue Novel MD, Nolon Rod.   . Gout 05/13/2009   Qualifier: Diagnosis of  By: Drue Novel MD, Nolon Rod.     Past Surgical History:  Procedure Laterality Date  . APPENDECTOMY    . CHOLECYSTECTOMY    . THROAT SURGERY     vocal cord polyp    Allergies as of 12/27/2019      Reactions   Sulfa Antibiotics    Patient states " Felt as if I was going crazy"       Medication List       Accurate as of December 27, 2019 11:59 PM. If you have any questions, ask your nurse or doctor.        STOP taking these medications   ALPRAZolam 0.5 MG tablet Commonly known as: Xanax Stopped by: Willow Ora, MD   Colchicine 0.6 MG Caps Commonly known as: Mitigare Stopped by: Willow Ora, MD   escitalopram 10 MG tablet Commonly known as: LEXAPRO Stopped by: Willow Ora, MD   predniSONE 10 MG tablet Commonly known as: DELTASONE Stopped by: Willow Ora, MD     TAKE these medications   indomethacin 50 MG capsule Commonly known as: INDOCIN Take 1 capsule (50 mg total) by mouth 3 (three) times daily as needed for moderate pain.          Objective:   Physical Exam BP 116/80 (BP Location: Left Arm, Patient Position: Sitting, Cuff Size: Normal)   Pulse 75   Temp 98 F (36.7 C) (Oral)   Resp 18   Ht 5\' 10"  (1.778 m)   Wt 282 lb 2 oz (128 kg)    SpO2 94%   BMI 40.48 kg/m  General:   Well developed, NAD, BMI noted. HEENT:  Normocephalic . Face symmetric, atraumatic Lungs:  CTA B Normal respiratory effort, no intercostal retractions, no accessory muscle use. Heart: RRR,  no murmur.  Lower extremities: no pretibial edema bilaterally  Skin: Not pale. Not jaundice Neurologic:  alert & oriented X3.  Speech normal, gait appropriate for age and unassisted. Motor and DTR symmetric. Romberg absent. Psych--  Cognition and judgment appear intact.  Cooperative with normal attention span and concentration.  Behavior appropriate. No anxious or depressed appearing.      Assessment     Assessment  Anxiety, depression, insomnia --used to see Dr. , started to get prescriptions here 12-2014, on prozac/sonata prn; self d/c prozac 2017 H/o ED (d/t  Prozac) Gout: dx in the 90s, usually one episode a year Morbid obesity OSA, sleep study 09/2016 [facility] 19.4/hour, RX CPAP  PLAN: Dizziness:  As described above, orthostatic vital signs negative. Suspect peripheral issue but sxs happen sometimes at rest. Plan: CMP, CBC, brain MRI. Otherwise we will give it time, rest, fluids.  Call if symptoms are not improving or they are severe.  See  AVS. OSA: Not using a CPAP consistently, patient educated about risks of CAD or stroke, encouraged to go back on the CPAP. Gout: On Indocin only as needed, unable to afford colchicine. Preventive care: Had a Covid vaccination, encouraged to have a flu shot RTC CPX to 3 months  This visit occurred during the SARS-CoV-2 public health emergency.  Safety protocols were in place, including screening questions prior to the visit, additional usage of staff PPE, and extensive cleaning of exam room while observing appropriate contact time as indicated for disinfecting solutions.

## 2019-12-27 NOTE — Patient Instructions (Signed)
Rest, drink plenty of fluids.  If the dizziness is not gradually resolving in the next 2 weeks to me know  If you have any unusual or severe dizziness or headache call immediately  We are scheduling a brain MRI  GO TO THE LAB : Get the blood work     GO TO THE FRONT DESK, PLEASE SCHEDULE YOUR APPOINTMENTS Come back for a physical exam in 2 or 3 months

## 2019-12-28 LAB — CBC WITH DIFFERENTIAL/PLATELET
Absolute Monocytes: 456 cells/uL (ref 200–950)
Basophils Absolute: 29 cells/uL (ref 0–200)
Basophils Relative: 0.6 %
Eosinophils Absolute: 211 cells/uL (ref 15–500)
Eosinophils Relative: 4.4 %
HCT: 42.3 % (ref 38.5–50.0)
Hemoglobin: 14.6 g/dL (ref 13.2–17.1)
Lymphs Abs: 1099 cells/uL (ref 850–3900)
MCH: 32.2 pg (ref 27.0–33.0)
MCHC: 34.5 g/dL (ref 32.0–36.0)
MCV: 93.4 fL (ref 80.0–100.0)
MPV: 8.9 fL (ref 7.5–12.5)
Monocytes Relative: 9.5 %
Neutro Abs: 3005 cells/uL (ref 1500–7800)
Neutrophils Relative %: 62.6 %
Platelets: 178 10*3/uL (ref 140–400)
RBC: 4.53 10*6/uL (ref 4.20–5.80)
RDW: 12.8 % (ref 11.0–15.0)
Total Lymphocyte: 22.9 %
WBC: 4.8 10*3/uL (ref 3.8–10.8)

## 2019-12-28 LAB — COMPREHENSIVE METABOLIC PANEL
AG Ratio: 1.8 (calc) (ref 1.0–2.5)
ALT: 23 U/L (ref 9–46)
AST: 29 U/L (ref 10–40)
Albumin: 4.5 g/dL (ref 3.6–5.1)
Alkaline phosphatase (APISO): 85 U/L (ref 36–130)
BUN: 17 mg/dL (ref 7–25)
CO2: 27 mmol/L (ref 20–32)
Calcium: 9.4 mg/dL (ref 8.6–10.3)
Chloride: 106 mmol/L (ref 98–110)
Creat: 1.05 mg/dL (ref 0.60–1.35)
Globulin: 2.5 g/dL (calc) (ref 1.9–3.7)
Glucose, Bld: 90 mg/dL (ref 65–99)
Potassium: 4 mmol/L (ref 3.5–5.3)
Sodium: 141 mmol/L (ref 135–146)
Total Bilirubin: 1.9 mg/dL — ABNORMAL HIGH (ref 0.2–1.2)
Total Protein: 7 g/dL (ref 6.1–8.1)

## 2019-12-28 NOTE — Assessment & Plan Note (Signed)
Dizziness:  As described above, orthostatic vital signs negative. Suspect peripheral issue but sxs happen sometimes at rest. Plan: CMP, CBC, brain MRI. Otherwise we will give it time, rest, fluids.  Call if symptoms are not improving or they are severe.  See AVS. OSA: Not using a CPAP consistently, patient educated about risks of CAD or stroke, encouraged to go back on the CPAP. Gout: On Indocin only as needed, unable to afford colchicine. Preventive care: Had a Covid vaccination, encouraged to have a flu shot RTC CPX to 3 months

## 2019-12-29 ENCOUNTER — Encounter: Payer: Self-pay | Admitting: Internal Medicine

## 2019-12-30 ENCOUNTER — Ambulatory Visit (HOSPITAL_BASED_OUTPATIENT_CLINIC_OR_DEPARTMENT_OTHER): Payer: BC Managed Care – PPO

## 2020-01-05 ENCOUNTER — Telehealth: Payer: Self-pay | Admitting: Internal Medicine

## 2020-01-05 MED ORDER — ALPRAZOLAM 0.5 MG PO TABS: 0.2500 mg | ORAL_TABLET | Freq: Three times a day (TID) | ORAL | 0 refills | Status: DC | PRN

## 2020-01-05 NOTE — Telephone Encounter (Signed)
Spoke with the patient, few days ago was feeling dizzy, he took half Xanax (a leftover) and shortly after he felt very well. From time to time he feels anxious, symptoms are definitely not persistent. We agreed to restart Xanax 0.5 mg half tablet 3 times daily as needed for the next few months. Although he did not feel  drowsy when he tried Xanax the last time advised not to drive if he feels sleepy.

## 2020-01-12 DIAGNOSIS — R42 Dizziness and giddiness: Secondary | ICD-10-CM | POA: Diagnosis not present

## 2020-01-15 ENCOUNTER — Encounter: Payer: Self-pay | Admitting: Internal Medicine

## 2020-01-18 ENCOUNTER — Other Ambulatory Visit: Payer: BC Managed Care – PPO

## 2020-01-18 ENCOUNTER — Other Ambulatory Visit: Payer: Self-pay

## 2020-01-18 DIAGNOSIS — R519 Headache, unspecified: Secondary | ICD-10-CM | POA: Diagnosis not present

## 2020-01-19 LAB — SEDIMENTATION RATE: Sed Rate: 6 mm/h (ref 0–15)

## 2020-05-24 DIAGNOSIS — Z20822 Contact with and (suspected) exposure to covid-19: Secondary | ICD-10-CM | POA: Diagnosis not present

## 2020-05-28 ENCOUNTER — Encounter: Payer: Self-pay | Admitting: Internal Medicine

## 2020-05-29 ENCOUNTER — Other Ambulatory Visit: Payer: Self-pay

## 2020-05-29 ENCOUNTER — Telehealth (INDEPENDENT_AMBULATORY_CARE_PROVIDER_SITE_OTHER): Payer: BC Managed Care – PPO | Admitting: Internal Medicine

## 2020-05-29 VITALS — Ht 70.0 in | Wt 280.0 lb

## 2020-05-29 DIAGNOSIS — U071 COVID-19: Secondary | ICD-10-CM | POA: Diagnosis not present

## 2020-05-29 NOTE — Progress Notes (Signed)
Subjective:    Patient ID: Tyler Doyle, male    DOB: 1975/07/04, 45 y.o.   MRN: [id number]  DOS:  05/29/2020 Type of visit - description: Virtual Visit via Video Note  I connected with the above patient  by a video enabled telemedicine application and verified that I am speaking with the correct person using two identifiers.   THIS ENCOUNTER IS A VIRTUAL VISIT DUE TO COVID-19 - PATIENT WAS NOT SEEN IN THE OFFICE. PATIENT HAS CONSENTED TO VIRTUAL VISIT / TELEMEDICINE VISIT   Location of patient: home  Location of provider: office  Persons participating in the virtual visit: patient, provider   I discussed the limitations of evaluation and management by telemedicine and the availability of in person appointments. The patient expressed understanding and agreed to proceed.  HPI: Symptoms started 10 days ago: Nausea, fever on and off.  Dry cough. Had 2 at home COVID test that were negative. He got somewhat better but fever resurface 6 days ago, up to 101 degrees. Eventually got a PCR Covid test 5 days ago and it came back positive.  Since then, fever is gradually decreasing, no fever for 2 days. Sinus congestion resolved Never had chest pain, no difficulty breathing No myalgias or headache.   Review of Systems See above   Past Medical History:  Diagnosis Date  . Anxiety and depression 05/13/2009   Qualifier: Diagnosis of  By: Drue Novel MD, Nolon Rod.   . Gout 05/13/2009   Qualifier: Diagnosis of  By: Drue Novel MD, Nolon Rod.     Past Surgical History:  Procedure Laterality Date  . APPENDECTOMY    . CHOLECYSTECTOMY    . THROAT SURGERY     vocal cord polyp    Allergies as of 05/29/2020      Reactions   Sulfa Antibiotics    Patient states " Felt as if I was going crazy"       Medication List       Accurate as of May 29, 2020 11:33 AM. If you have any questions, ask your nurse or doctor.        ALPRAZolam 0.5 MG tablet Commonly known as: Xanax Take 0.5 tablets (0.25 mg  total) by mouth 3 (three) times daily as needed for anxiety.   indomethacin 50 MG capsule Commonly known as: INDOCIN Take 1 capsule (50 mg total) by mouth 3 (three) times daily as needed for moderate pain.          Objective:   Physical Exam Ht 5\' 10"  (1.778 m)   Wt 280 lb (127 kg)   BMI 40.18 kg/m  This is a virtual video visit, alert oriented x3, in no distress, no vital signs available.  Speaking in complete sentences    Assessment     Assessment  Anxiety, depression, insomnia --used to see Dr. , started to get prescriptions here 12-2014, on prozac/sonata prn; self d/c prozac 2017 H/o ED (d/t  Prozac) Gout: dx in the 90s, usually one episode a year Morbid obesity OSA, sleep study 09/2016 [facility] 19.4/hour, RX CPAP COVID-19 infection 05/2020  PLAN: COVID-19: The patient is 45,  had 47 vaccine last year, symptoms started 10 days ago and is doing better except for lingering cough. Plan: Continue Mucinex DM, good hydration, monitor symptoms, call if they resurface. Do recommend to proceed with a COVID vaccine booster in 3 to 4 weeks ([employer] or [employer]). Reminded to schedule a CPX at his convenience.  I discussed the assessment and treatment plan with the patient. The patient was provided an opportunity to ask questions and all were answered. The patient agreed with the plan and demonstrated an understanding of the instructions.   The patient was advised to call back or seek an in-person evaluation if the symptoms worsen or if the condition fails to improve as anticipated.

## 2020-05-31 NOTE — Assessment & Plan Note (Signed)
COVID-19: The patient is 66,  had [employer] vaccine last year, symptoms started 10 days ago and is doing better except for lingering cough. Plan: Continue Mucinex DM, good hydration, monitor symptoms, call if they resurface. Do recommend to proceed with a COVID vaccine booster in 3 to 4 weeks ([employer] or [employer]). Reminded to schedule a CPX at his convenience.

## 2020-06-21 ENCOUNTER — Encounter: Payer: Self-pay | Admitting: Internal Medicine

## 2020-10-03 DIAGNOSIS — Z20822 Contact with and (suspected) exposure to covid-19: Secondary | ICD-10-CM | POA: Diagnosis not present

## 2020-10-08 ENCOUNTER — Other Ambulatory Visit: Payer: Self-pay

## 2020-10-08 ENCOUNTER — Ambulatory Visit: Payer: BC Managed Care – PPO | Admitting: Internal Medicine

## 2020-10-08 ENCOUNTER — Encounter: Payer: Self-pay | Admitting: Internal Medicine

## 2020-10-08 VITALS — BP 134/82 | HR 86 | Temp 98.4°F | Resp 16 | Ht 70.0 in | Wt 307.1 lb

## 2020-10-08 DIAGNOSIS — J019 Acute sinusitis, unspecified: Secondary | ICD-10-CM | POA: Diagnosis not present

## 2020-10-08 MED ORDER — KETOCONAZOLE 2 % EX CREA: 1.0000 "application " | TOPICAL_CREAM | Freq: Every day | CUTANEOUS | 0 refills | Status: DC

## 2020-10-08 MED ORDER — AMOXICILLIN 875 MG PO TABS: 875.0000 mg | ORAL_TABLET | Freq: Two times a day (BID) | ORAL | 0 refills | Status: DC

## 2020-10-08 NOTE — Progress Notes (Signed)
Subjective:    Patient ID: Tyler Doyle, male    DOB: 11-25-1975, 45 y.o.   MRN: [id number]  DOS:  10/08/2020 Type of visit - description: acute Symptoms a started 6 days ago: Sinus congestion, 2 days later developed fever up to 101.0. Felt a lot of throat congestion and mucus pulling in the throat. He checked then a COVID test, PCR: Negative.  Overall today he feels somewhat better, fever is largely resolved. Still have some congestion. Denies nausea, vomiting, diarrhea. No headache other than sinus pressure.  Review of Systems See above   Past Medical History:  Diagnosis Date   Anxiety and depression 05/13/2009   Qualifier: Diagnosis of  By: Drue Novel MD, Nolon Rod.    Gout 05/13/2009   Qualifier: Diagnosis of  By: Drue Novel MD, Nolon Rod.     Past Surgical History:  Procedure Laterality Date   APPENDECTOMY     CHOLECYSTECTOMY     THROAT SURGERY     vocal cord polyp    Allergies as of 10/08/2020       Reactions   Sulfa Antibiotics    Patient states " Felt as if I was going crazy"         Medication List        Accurate as of October 08, 2020 11:59 PM. If you have any questions, ask your nurse or doctor.          ALPRAZolam 0.5 MG tablet Commonly known as: Xanax Take 0.5 tablets (0.25 mg total) by mouth 3 (three) times daily as needed for anxiety.   amoxicillin 875 MG tablet Commonly known as: AMOXIL Take 1 tablet (875 mg total) by mouth 2 (two) times daily. Started by: Willow Ora, MD   indomethacin 50 MG capsule Commonly known as: INDOCIN Take 1 capsule (50 mg total) by mouth 3 (three) times daily as needed for moderate pain.   ketoconazole 2 % cream Commonly known as: NIZORAL Apply 1 application topically daily. Started by: Willow Ora, MD           Objective:   Physical Exam BP 134/82 (BP Location: Left Arm, Patient Position: Sitting, Cuff Size: Normal)   Pulse 86   Temp 98.4 F (36.9 C) (Oral)   Resp 16   Ht 5\' 10"  (1.778 m)   Wt (!) 307 lb 2 oz (139.3  kg)   SpO2 98%   BMI 44.07 kg/m  General:   Well developed, NAD, BMI noted. HEENT:  Normocephalic . Face symmetric, atraumatic. Sinuses: No TTP Nose: Congested. Throat: Very hard to evaluate, he has a crowded throat, however it seems somewhat red but no white patches. Lungs:  CTA B Normal respiratory effort, no intercostal retractions, no accessory muscle use. Heart: RRR,  no murmur.  Lower extremities: no pretibial edema bilaterally  Skin: Not pale. Not jaundice Neurologic:  alert & oriented X3.  Speech normal, gait appropriate for age and unassisted Psych--  Cognition and judgment appear intact.  Cooperative with normal attention span and concentration.  Behavior appropriate. No anxious or depressed appearing.      Assessment    Assessment  Anxiety, depression, insomnia --used to see Dr. , started to get prescriptions here 12-2014, on prozac/sonata prn; self d/c prozac 2017 H/o ED (d/t  Prozac) Gout: dx in the 90s, usually one episode a year Morbid obesity OSA, sleep study 09/2016 [facility] 19.4/hour, RX CPAP COVID-19 infection 05/2020  PLAN: Sinusitis: Symptoms suspicious for sinusitis, PCR COVID test was negative few days ago. Plan:  Tylenol, Flonase, amoxicillin, Mucinex, try to avoid Sudafed due to risk of increased BP. R wrist pain: At the end of the visit he mentioned he did some heavy lifting 2 weeks ago and since then he has pain at the ulnar side R wrist mostly when he moves his hand.  Overall symptoms are improving.  Exam is essentially negative, he possibly has tendinitis, recommend observation. Feet rash: Was prescribed ketoconazole before, request a refill.  Refill sent, reassess on RTC Preventive care: Strongly encouraged to consider second COVID-vaccine after he feels better. Due for CPX: Strongly encouraged to come back    This visit occurred during the SARS-CoV-2 public health emergency.  Safety protocols were in place, including screening questions  prior to the visit, additional usage of staff PPE, and extensive cleaning of exam room while observing appropriate contact time as indicated for disinfecting solutions.

## 2020-10-08 NOTE — Patient Instructions (Signed)
Please schedule a physical exam delete  While you are better consider COVID vaccination   You have sinusitis: Take amoxicillin, I sent the prescription Flonase over-the-counter: 2 sprays on each side of the nose once a day until better Mucinex or Mucinex DM as needed for congestion Tylenol as needed Try to avoid Sudafed, it may increase your blood pressure  Call if not gradually better  I sent a cream for your feet.

## 2020-10-09 NOTE — Assessment & Plan Note (Signed)
Sinusitis: Symptoms suspicious for sinusitis, PCR COVID test was negative few days ago. Plan: Tylenol, Flonase, amoxicillin, Mucinex, try to avoid Sudafed due to risk of increased BP. R wrist pain: At the end of the visit he mentioned he did some heavy lifting 2 weeks ago and since then he has pain at the ulnar side R wrist mostly when he moves his hand.  Overall symptoms are improving.  Exam is essentially negative, he possibly has tendinitis, recommend observation. Feet rash: Was prescribed ketoconazole before, request a refill.  Refill sent, reassess on RTC Preventive care: Strongly encouraged to consider second COVID-vaccine after he feels better. Due for CPX: Strongly encouraged to come back

## 2021-01-21 DIAGNOSIS — G4733 Obstructive sleep apnea (adult) (pediatric): Secondary | ICD-10-CM | POA: Diagnosis not present

## 2021-02-25 ENCOUNTER — Telehealth (INDEPENDENT_AMBULATORY_CARE_PROVIDER_SITE_OTHER): Payer: BC Managed Care – PPO | Admitting: Internal Medicine

## 2021-02-25 ENCOUNTER — Other Ambulatory Visit: Payer: Self-pay

## 2021-02-25 VITALS — Ht 69.0 in | Wt 300.0 lb

## 2021-02-25 DIAGNOSIS — F419 Anxiety disorder, unspecified: Secondary | ICD-10-CM | POA: Diagnosis not present

## 2021-02-25 MED ORDER — ALPRAZOLAM 0.5 MG PO TABS: 0.2500 mg | ORAL_TABLET | Freq: Two times a day (BID) | ORAL | 0 refills | Status: DC | PRN

## 2021-02-25 MED ORDER — FLUOXETINE HCL 10 MG PO CAPS: ORAL_CAPSULE | ORAL | 1 refills | Status: DC

## 2021-02-25 NOTE — Progress Notes (Signed)
Subjective:    Patient ID: Tyler Doyle, male    DOB: [birth year], 45 y.o.   MRN: [id number]  DOS:  02/25/2021 Type of visit - description: Virtual Visit via Video Note  I connected with the above patient  by a video enabled telemedicine application and verified that I am speaking with the correct person using two identifiers.   THIS ENCOUNTER IS A VIRTUAL VISIT DUE TO COVID-19 - PATIENT WAS NOT SEEN IN THE OFFICE. PATIENT HAS CONSENTED TO VIRTUAL VISIT / TELEMEDICINE VISIT   Location of patient: home  Location of provider: office  Persons participating in the virtual visit: patient, provider   I discussed the limitations of evaluation and management by telemedicine and the availability of in person appointments. The patient expressed understanding and agreed to proceed.  Acute    Symptoms a started approximately 4 weeks ago: Reports a lot of stress over the last 4 weeks particularly over the last week. Stress is work-related, he works at [employer], does frequent public speaking and that seems to be a major trigger for him. When he is stress, he feels also dizzy and has a "heavy head". Symptoms are similar to when he got dizzy before, see visit note from 12-2019.  Last night he had palpitations, he took Xanax and that helped. Denies feeling excessively sleepy.  Denies depression per se, sleeping okay. Denies chest pain no difficulty breathing Denies history speech, double vision, facial numbness or focal deficits No suicidal ideas     Review of Systems See above   Past Medical History:  Diagnosis Date   Anxiety and depression 05/13/2009   Qualifier: Diagnosis of  By: Drue Novel MD, Nolon Rod.    Gout 05/13/2009   Qualifier: Diagnosis of  By: Drue Novel MD, Nolon Rod.     Past Surgical History:  Procedure Laterality Date   APPENDECTOMY     CHOLECYSTECTOMY     THROAT SURGERY     vocal cord polyp    Allergies as of 02/25/2021       Reactions   Sulfa Antibiotics    Patient states "  Felt as if I was going crazy"         Medication List        Accurate as of February 25, 2021  4:28 PM. If you have any questions, ask your nurse or doctor.          STOP taking these medications    amoxicillin 875 MG tablet Commonly known as: AMOXIL Stopped by: Willow Ora, MD       TAKE these medications    ALPRAZolam 0.5 MG tablet Commonly known as: Xanax Take 0.5 tablets (0.25 mg total) by mouth 3 (three) times daily as needed for anxiety.   Colchicine 0.6 MG Caps Take by mouth.   indomethacin 50 MG capsule Commonly known as: INDOCIN Take 1 capsule (50 mg total) by mouth 3 (three) times daily as needed for moderate pain.   ketoconazole 2 % cream Commonly known as: NIZORAL Apply 1 application topically daily.           Objective:   Physical Exam Ht 5\' 9"  (1.753 m)   Wt 300 lb (136.1 kg)   BMI 44.30 kg/m  The patient looks alert oriented x3, in no physical distress, no emotional distress, face is symmetric, speech is normal.    Assessment     Assessment  Anxiety, depression, insomnia --used to see Dr. , started to get prescriptions here 12-2014, on prozac/sonata prn;  self d/c prozac 2017 H/o ED (d/t  Prozac) Gout: dx in the 90s, usually one episode a year Morbid obesity OSA, sleep study 09/2016 [facility] 19.4/hour, RX CPAP COVID-19 infection 05/2020  PLAN: Anxiety: Symptoms a started 4 weeks ago, stress is work-related, he is having also dizziness and some palpitations. He had similar dizziness last year, work-up included a brain MRI >>> negative. Today he reports no depression or insomnia. Advised that treatment is psychotherapy, recommend to engage with a counselor, will send a list of local counselors. Okay to continue Xanax, Rx sent, he knows not to take if he needs to be driving.    In the past fluoxetine worked really well for depression, I think is going to help at this time as well, prescription sent. Definitely call if symptoms  severe. Follow-up in person 4 to 5 weeks, patient will call and make an appointment    I discussed the assessment and treatment plan with the patient. The patient was provided an opportunity to ask questions and all were answered. The patient agreed with the plan and demonstrated an understanding of the instructions.   The patient was advised to call back or seek an in-person evaluation if the symptoms worsen or if the condition fails to improve as anticipated.

## 2021-02-26 ENCOUNTER — Telehealth: Payer: Self-pay

## 2021-02-26 DIAGNOSIS — F419 Anxiety disorder, unspecified: Secondary | ICD-10-CM | POA: Insufficient documentation

## 2021-02-26 NOTE — Assessment & Plan Note (Signed)
Assessment  Anxiety, depression, insomnia --used to see Dr. Tomasa Rand, started to get prescriptions here 12-2014, on prozac/sonata prn; self d/c prozac 2017 H/o ED (d/t  Prozac) Gout: dx in the 90s, usually one episode a year Morbid obesity OSA, sleep study 09/2016 [facility] 19.4/hour, RX CPAP COVID-19 infection 05/2020  PLAN: Anxiety: Symptoms a started 4 weeks ago, stress is work-related, he is having also dizziness and some palpitations. He had similar dizziness last year, work-up included a brain MRI >>> negative. Today he reports no depression or insomnia. Advised that treatment is psychotherapy, recommend to engage with a counselor, will send a list of local counselors. Okay to continue Xanax, Rx sent, he knows not to take if he needs to be driving.    In the past fluoxetine worked really well for depression, I think is going to help at this time as well, prescription sent. Definitely call if symptoms severe. Follow-up in person 4 to 5 weeks, patient will call and make an appointment

## 2021-02-26 NOTE — Telephone Encounter (Signed)
PA initiated via Covermymeds; KEY: [id number]. Awaiting determination.

## 2021-02-28 NOTE — Telephone Encounter (Signed)
PA approved. Effective from 02/26/2021 through 05/27/2021. Approved for 90-day titration

## 2021-05-07 ENCOUNTER — Telehealth: Payer: Self-pay | Admitting: Internal Medicine

## 2021-05-07 NOTE — Telephone Encounter (Signed)
Pt stated he has been having migraines that cause him to feel dizzy/lightheaded. He was sched tomorrow with Drue Novel and also triaged.

## 2021-05-07 NOTE — Telephone Encounter (Signed)
Nurse Assessment Nurse: Breeding, RN, [place] Date/Time (Eastern Time): 05/07/2021 3:09:04 PM Confirm and document reason for call. If symptomatic, describe symptoms. ---The caller states that he has been experiencing lightheadedness, a mild migraine, and upper/lower back and leg pain. Pain is sharp and to the left side and left leg. The caller states that his light-headedness has been ongoing for a few weeks. Caller denies any other s/s at this time. Migraine started yesterday and pain started today. Does the patient have any new or worsening symptoms? ---Yes Will a triage be completed? ---Yes Related visit to physician within the last 2 weeks? ---No Does the PT have any chronic conditions? (i.e. diabetes, asthma, this includes High risk factors for pregnancy, etc.) ---Yes List chronic conditions. ---Gout Is this a behavioral health or substance abuse call? ---No Guidelines Guideline Title Affirmed Question Affirmed Notes Nurse Date/Time (Eastern Time) Back Pain [1] Pain radiates into the thigh or further down the leg AND [2] one leg Breeding, RN, [place] 05/07/2021 3:11:08 PM PLEASE NOTE: All timestamps contained within this report are represented as [place] Standard Time. CONFIDENTIALTY NOTICE: This fax transmission is intended only for the addressee. It contains information that is legally privileged, confidential or otherwise protected from use or disclosure. If you are not the intended recipient, you are strictly prohibited from reviewing, disclosing, copying using or disseminating any of this information or taking any action in reliance on or regarding this information. If you have received this fax in error, please notify us immediately by telephone so that we can arrange for its return to [place]. Phone: [phone], Toll-Free: [phone], Fax: [phone] Page: 2 of 2 Call Id: [id number] Disp. Time Tyler Doyle Time) Disposition Final User 05/07/2021 3:18:08 PM SEE PCP  WITHIN 3 DAYS Yes Breeding, RN, [place] Caller Disagree/Comply Marine scientist Understands Yes PreDisposition Call Doctor Care Advice Given Per Guideline SEE PCP WITHIN 3 DAYS: * You need to be seen within 2 or 3 days. USE HEAT: * Use a heat pack, heating pad, or warm wet washcloth. * Do this for 10 minutes three times a day. * This will help increase blood flow and decrease pain. * Caution: burn. Do not sleep on a heating pad. SLEEP: * Sleep on your side with a pillow between your knees. ACTIVITY: * Use caution and commonsense when performing heavy lifting. Similarly, be careful during strenuous exercise. * Avoid any activities that cause severe pain. PAIN MEDICINES: * For pain relief, you can take either acetaminophen, ibuprofen, or naproxen. * They are over-the-counter (OTC) pain drugs. You can buy them at the drugstore. * Use the lowest amount of medicine that makes your pain better. PAIN MEDICINES - EXTRA NOTES AND WARNINGS: * Before taking any medicine, read all the instructions on the package. CALL BACK IF: * Numbness or weakness occurs, or bowel/bladder problems * There are any urine symptoms or fever * You become worse CARE ADVICE given per Back Pain (Adult) guideline. Comments User: Vickie Epley, RN Date/Time Tyler Doyle Time): 05/07/2021 3:12:33 PM Back pain rated at 5 on a 0/10 scale and head a 6/7 on a 0/10 scale User: [place], RN Date/Time (Eastern Time): 05/07/2021 3:19:19 PM Advised caller to call office to schedule an appointment to be seen in the next 2-3 days. Caller verbalized understanding. Referrals REFERRED TO PCP OFFICE   FYI- Pt has appt tomorrow.

## 2021-05-07 NOTE — Telephone Encounter (Signed)
Will wait for triage note.  

## 2021-05-07 NOTE — Telephone Encounter (Signed)
Molli Knock, will see him tomorrow

## 2021-05-08 ENCOUNTER — Ambulatory Visit (INDEPENDENT_AMBULATORY_CARE_PROVIDER_SITE_OTHER): Payer: BC Managed Care – PPO | Admitting: Internal Medicine

## 2021-05-08 ENCOUNTER — Encounter: Payer: Self-pay | Admitting: Internal Medicine

## 2021-05-08 VITALS — BP 132/82 | HR 65 | Temp 97.7°F | Resp 16 | Ht 70.0 in | Wt 312.0 lb

## 2021-05-08 DIAGNOSIS — R519 Headache, unspecified: Secondary | ICD-10-CM | POA: Diagnosis not present

## 2021-05-08 DIAGNOSIS — G4733 Obstructive sleep apnea (adult) (pediatric): Secondary | ICD-10-CM

## 2021-05-08 DIAGNOSIS — R42 Dizziness and giddiness: Secondary | ICD-10-CM | POA: Diagnosis not present

## 2021-05-08 DIAGNOSIS — G43909 Migraine, unspecified, not intractable, without status migrainosus: Secondary | ICD-10-CM

## 2021-05-08 MED ORDER — BUSPIRONE HCL 7.5 MG PO TABS: 7.5000 mg | ORAL_TABLET | Freq: Two times a day (BID) | ORAL | 1 refills | Status: DC

## 2021-05-08 NOTE — Assessment & Plan Note (Signed)
Dizziness: Actually described as lightheadedness since November.  Had similar episodes before, brain MRI 2021 >> negative.  Neuro exam negative, refer to neurology. Headache: Started 2 days ago, now resolved, when asked admits this was "worst of his life".  Rec CT head today.  Call if symptoms resurface Anxiety: At the last visit we agreed on fluoxetine, he feels "odd" when he takes it so he self d/c.  Although his stress from work has decreased he is still feels anxious on and off. I do believe anxiety is playing a role on dizziness and headache. Plan: Trial with BuSpar, continue Xanax as needed. OSA: Uses every night but often times wakes up and is not wearing the mask.  Encouraged consistent use. RTC 4 weeks.

## 2021-05-08 NOTE — Progress Notes (Signed)
Subjective:    Patient ID: Tyler Doyle, male    DOB: 1975/12/19, 46 y.o.   MRN: [id number]  DOS:  05/08/2021 Type of visit - description: Acute, several concerns  Episodes of lightheadedness since November 2022, almost daily, last several hours, no associated nausea vomiting.  No headaches, no confusion. Light intolerance?Marland Kitchen  Also 2 days ago developed headache, sudden onset, located at the front of the head and the top.  No associated neck stiffness, no recent head injury.  No fever or chills.  No nausea or vomiting. When asked, reports this was the worst HA of his life. It lasted approximately 36 hours, decreased with rest and ibuprofen. No light or light on nose intolerance?  "Everything bothered me".  Anxiety: Not on SSRIs due to side effects, has been taking Xanax lately.  He changed jobs, stress has decreased but still feels anxious on and off.  Denies depression.  Reports fatigue, uses CPAP every night but frequently wakes up and he is not wearing the mask.   Review of Systems See above   Past Medical History:  Diagnosis Date   Anxiety and depression 05/13/2009   Qualifier: Diagnosis of  By: Drue Novel MD, Nolon Rod.    Gout 05/13/2009   Qualifier: Diagnosis of  By: Drue Novel MD, Nolon Rod.     Past Surgical History:  Procedure Laterality Date   APPENDECTOMY     CHOLECYSTECTOMY     THROAT SURGERY     vocal cord polyp    Current Outpatient Medications  Medication Instructions   ALPRAZolam (XANAX) 0.25 mg, Oral, 2 times daily PRN   Colchicine 0.6 MG CAPS Oral   FLUoxetine (PROZAC) 10 MG capsule 1 tablet a day for 1 week, then 2 tablets daily   indomethacin (INDOCIN) 50 mg, Oral, 3 times daily PRN   ketoconazole (NIZORAL) 2 % cream 1 application, Topical, Daily       Objective:   Physical Exam BP 132/82 (BP Location: Left Arm, Patient Position: Sitting, Cuff Size: Normal)    Pulse 65    Temp 97.7 F (36.5 C) (Oral)    Resp 16    Ht 5\' 10"  (1.778 m)    Wt (!) 312 lb (141.5 kg)     SpO2 96%    BMI 44.77 kg/m  General:   Well developed, NAD, BMI noted. HEENT:  Normocephalic . Face symmetric, atraumatic. Neck: Supple, full range of motion. Lungs:  CTA B Normal respiratory effort, no intercostal retractions, no accessory muscle use. Heart: RRR,  no murmur.  Lower extremities: no pretibial edema bilaterally  Skin: Not pale. Not jaundice Neurologic:  alert & oriented X3.  Speech normal, gait appropriate for age and unassisted. EOMI, pupils equal and reactive, motor and DTR symmetric. Psych--  Cognition and judgment appear intact.  Cooperative with normal attention span and concentration.  Behavior appropriate. No anxious or depressed appearing.      Assessment     Assessment  Anxiety, depression, insomnia --used to see Dr. , started to get prescriptions here 12-2014, on prozac/sonata prn; self d/c prozac 2017 H/o ED (d/t  Prozac) Gout: dx in the 90s, usually one episode a year Morbid obesity OSA, sleep study 09/2016 [facility] 19.4/hour, RX CPAP COVID-19 infection 05/2020 Dizziness: Normal brain MRI 01/12/2020 (care everywhere).  PLAN: Dizziness: Actually described as lightheadedness since November.  Had similar episodes before, brain MRI 2021 >> negative.  Neuro exam negative, refer to neurology. Headache: Started 2 days ago, now resolved, when asked admits this was "  worst of his life".  Rec CT head today.  Call if symptoms resurface Anxiety: At the last visit we agreed on fluoxetine, he feels "odd" when he takes it so he self d/c.  Although his stress from work has decreased he is still feels anxious on and off. I do believe anxiety is playing a role on dizziness and headache. Plan: Trial with BuSpar, continue Xanax as needed. OSA: Uses every night but often times wakes up and is not wearing the mask.  Encouraged consistent use. RTC 4 weeks.    This visit occurred during the SARS-CoV-2 public health emergency.  Safety protocols were in place, including  screening questions prior to the visit, additional usage of staff PPE, and extensive cleaning of exam room while observing appropriate contact time as indicated for disinfecting solutions.

## 2021-05-08 NOTE — Patient Instructions (Addendum)
Please schedule a physical exam 4 weeks from today   We are referring you to a neurologist for episodes of dizziness  Continue Xanax as needed  Start BuSpar for anxiety: One at bedtime the first 5 days, then TWICE a day   Proceed with a head CT. If you have again severe headaches let us know.

## 2021-06-02 ENCOUNTER — Telehealth: Payer: Self-pay | Admitting: Internal Medicine

## 2021-06-02 ENCOUNTER — Encounter: Payer: Self-pay | Admitting: Internal Medicine

## 2021-06-02 ENCOUNTER — Other Ambulatory Visit: Payer: Self-pay | Admitting: Internal Medicine

## 2021-06-02 DIAGNOSIS — M109 Gout, unspecified: Secondary | ICD-10-CM

## 2021-06-02 MED ORDER — PREDNISONE 10 MG PO TABS: ORAL_TABLET | ORAL | 0 refills | Status: DC

## 2021-06-02 NOTE — Progress Notes (Signed)
° °  Subjective:    Patient ID: Tyler Doyle, male    DOB: [birth year], 45 y.o.   MRN: [id number]  DOS:  06/02/2021 Type of visit - description:     Review of Systems See above   Past Medical History:  Diagnosis Date   Anxiety and depression 05/13/2009   Qualifier: Diagnosis of  By: Drue Novel MD, Nolon Rod.    Gout 05/13/2009   Qualifier: Diagnosis of  By: Drue Novel MD, Nolon Rod.     Past Surgical History:  Procedure Laterality Date   APPENDECTOMY     CHOLECYSTECTOMY     THROAT SURGERY     vocal cord polyp    Current Outpatient Medications  Medication Instructions   ALPRAZolam (XANAX) 0.25 mg, Oral, 2 times daily PRN   busPIRone (BUSPAR) 7.5 mg, Oral, 2 times daily   Colchicine 0.6 MG CAPS Oral   indomethacin (INDOCIN) 50 mg, Oral, 3 times daily PRN   ketoconazole (NIZORAL) 2 % cream 1 application, Topical, Daily       Objective:   Physical Exam There were no vitals taken for this visit.     Assessment        This visit occurred during the SARS-CoV-2 public health emergency.  Safety protocols were in place, including screening questions prior to the visit, additional usage of staff PPE, and extensive cleaning of exam room while observing appropriate contact time as indicated for disinfecting solutions.

## 2021-06-02 NOTE — Telephone Encounter (Signed)
Pt daughter has covid and pt states it is very hard for him to walk. He doesn't not want to wait till tomorrow to be seen. He is hoping something can be prescribed today. Please advise

## 2021-06-02 NOTE — Telephone Encounter (Signed)
See mychart messages- awaiting response from PCP.

## 2021-06-03 MED ORDER — INDOMETHACIN 50 MG PO CAPS: 50.0000 mg | ORAL_CAPSULE | Freq: Three times a day (TID) | ORAL | 0 refills | Status: DC | PRN

## 2021-06-03 NOTE — Addendum Note (Signed)
Addended byDamita Dunnings D on: 06/03/2021 10:12 AM   Modules accepted: Orders

## 2021-06-03 NOTE — Telephone Encounter (Signed)
Please send Indocin, 90 tablets and let the patient

## 2021-06-09 ENCOUNTER — Encounter: Payer: BC Managed Care – PPO | Admitting: Internal Medicine

## 2021-06-11 ENCOUNTER — Institutional Professional Consult (permissible substitution): Payer: Self-pay | Admitting: Neurology

## 2021-06-12 ENCOUNTER — Encounter: Payer: Self-pay | Admitting: Neurology

## 2021-06-30 ENCOUNTER — Telehealth: Payer: Self-pay

## 2021-06-30 ENCOUNTER — Ambulatory Visit (INDEPENDENT_AMBULATORY_CARE_PROVIDER_SITE_OTHER): Payer: Self-pay | Admitting: Internal Medicine

## 2021-06-30 ENCOUNTER — Other Ambulatory Visit: Payer: Self-pay | Admitting: Internal Medicine

## 2021-06-30 ENCOUNTER — Encounter: Payer: Self-pay | Admitting: Internal Medicine

## 2021-06-30 VITALS — BP 136/80 | HR 76 | Temp 98.4°F | Resp 16 | Ht 70.0 in | Wt 324.2 lb

## 2021-06-30 DIAGNOSIS — Z79899 Other long term (current) drug therapy: Secondary | ICD-10-CM

## 2021-06-30 DIAGNOSIS — M109 Gout, unspecified: Secondary | ICD-10-CM

## 2021-06-30 DIAGNOSIS — E669 Obesity, unspecified: Secondary | ICD-10-CM | POA: Diagnosis not present

## 2021-06-30 DIAGNOSIS — Z0000 Encounter for general adult medical examination without abnormal findings: Secondary | ICD-10-CM

## 2021-06-30 DIAGNOSIS — F419 Anxiety disorder, unspecified: Secondary | ICD-10-CM | POA: Diagnosis not present

## 2021-06-30 DIAGNOSIS — Z1159 Encounter for screening for other viral diseases: Secondary | ICD-10-CM

## 2021-06-30 DIAGNOSIS — F32A Depression, unspecified: Secondary | ICD-10-CM

## 2021-06-30 DIAGNOSIS — R42 Dizziness and giddiness: Secondary | ICD-10-CM

## 2021-06-30 LAB — URIC ACID: Uric Acid, Serum: 8.9 mg/dL — ABNORMAL HIGH (ref 4.0–7.8)

## 2021-06-30 LAB — CBC WITH DIFFERENTIAL/PLATELET
Basophils Absolute: 0 10*3/uL (ref 0.0–0.1)
Basophils Relative: 0.7 % (ref 0.0–3.0)
Eosinophils Absolute: 0.3 10*3/uL (ref 0.0–0.7)
Eosinophils Relative: 6.6 % — ABNORMAL HIGH (ref 0.0–5.0)
HCT: 40.4 % (ref 39.0–52.0)
Hemoglobin: 13.7 g/dL (ref 13.0–17.0)
Lymphocytes Relative: 24.9 % (ref 12.0–46.0)
Lymphs Abs: 1.1 10*3/uL (ref 0.7–4.0)
MCHC: 34 g/dL (ref 30.0–36.0)
MCV: 91.6 fl (ref 78.0–100.0)
Monocytes Absolute: 0.5 10*3/uL (ref 0.1–1.0)
Monocytes Relative: 12.1 % — ABNORMAL HIGH (ref 3.0–12.0)
Neutro Abs: 2.5 10*3/uL (ref 1.4–7.7)
Neutrophils Relative %: 55.7 % (ref 43.0–77.0)
Platelets: 180 10*3/uL (ref 150.0–400.0)
RBC: 4.42 Mil/uL (ref 4.22–5.81)
RDW: 14.3 % (ref 11.5–15.5)
WBC: 4.5 10*3/uL (ref 4.0–10.5)

## 2021-06-30 LAB — COMPREHENSIVE METABOLIC PANEL
ALT: 18 U/L (ref 0–53)
AST: 23 U/L (ref 0–37)
Albumin: 4 g/dL (ref 3.5–5.2)
Alkaline Phosphatase: 98 U/L (ref 39–117)
BUN: 12 mg/dL (ref 6–23)
CO2: 27 mEq/L (ref 19–32)
Calcium: 8.8 mg/dL (ref 8.4–10.5)
Chloride: 107 mEq/L (ref 96–112)
Creatinine, Ser: 1.02 mg/dL (ref 0.40–1.50)
GFR: 87.48 mL/min (ref >60.00)
Glucose, Bld: 98 mg/dL (ref 70–99)
Potassium: 4.1 mEq/L (ref 3.5–5.1)
Sodium: 142 mEq/L (ref 135–145)
Total Bilirubin: 1 mg/dL (ref 0.2–1.2)
Total Protein: 6.5 g/dL (ref 6.0–8.3)

## 2021-06-30 LAB — LIPID PANEL
Cholesterol: 139 mg/dL (ref 0–200)
HDL: 34.9 mg/dL — ABNORMAL LOW (ref >39.00)
LDL Cholesterol: 71 mg/dL (ref 0–99)
NonHDL: 103.92
Total CHOL/HDL Ratio: 4
Triglycerides: 164 mg/dL — ABNORMAL HIGH (ref 0.0–149.0)
VLDL: 32.8 mg/dL (ref 0.0–40.0)

## 2021-06-30 LAB — HEMOGLOBIN A1C: Hgb A1c MFr Bld: 5.1 % (ref 4.6–6.5)

## 2021-06-30 LAB — TSH: TSH: 4 u[IU]/mL (ref 0.35–5.50)

## 2021-06-30 MED ORDER — BUSPIRONE HCL 7.5 MG PO TABS: 7.5000 mg | ORAL_TABLET | Freq: Two times a day (BID) | ORAL | 1 refills | Status: DC

## 2021-06-30 NOTE — Patient Instructions (Addendum)
Please start structured diet: Weight watchers?  Calorie counting?  Noom? ? ?We are referring you to neurology again  ? ?Start buspirone for anxiety, send me a message in 4 weeks and let me know how that is working. ? ? ?GO TO THE LAB : Get the blood work   ? ? ?GO TO THE FRONT DESK, PLEASE SCHEDULE YOUR APPOINTMENTS ?Come back for a checkup in 3 months ?

## 2021-06-30 NOTE — Assessment & Plan Note (Signed)
Tdap: 2016 ?COVID-vaccine: Booster recommended. ?Flu vaccine: Has consistently declined. ?CCS: 3 options discussed, agreed on I fob ?Prostate cancer screening: Not indicated until age 48 ?Labs: CMP, FLP, CBC, A1c, TSH, uric acid, UDS, hep C ?Diet and exercise again recommended. A structured diet is strongly recommended ? ?  ?

## 2021-06-30 NOTE — Progress Notes (Signed)
? ?Subjective:  ? ? Patient ID: Tyler Doyle, male    DOB: [birth year], 46 y.o.   MRN: [id number] ? ?DOS:  06/30/2021 ?Type of visit - description: cpx ? ?Here for CPX. ?Today with talk about morbid obesity, anxiety, dizziness. ? ?Wt Readings from Last 3 Encounters:  ?06/30/21 (!) 324 lb 4 oz (147.1 kg)  ?05/08/21 (!) 312 lb (141.5 kg)  ?02/25/21 300 lb (136.1 kg)  ? ? ? ?Review of Systems ?Still feeling anxious, still having occasional dizziness and headache. ?No recent acute gout, from time to time has mild aches or pains at different parts of the lower extremity.  No classic  gout per se. ? ? ?Other than above, a 14 point review of systems is negative  ?  ? ?Past Medical History:  ?Diagnosis Date  ? Anxiety and depression 05/13/2009  ? Qualifier: Diagnosis of  By: Drue Novel MD, Nolon Rod.   ? Gout 05/13/2009  ? Qualifier: Diagnosis of  By: Drue Novel MD, Nolon Rod.   ? ? ?Past Surgical History:  ?Procedure Laterality Date  ? APPENDECTOMY    ? CHOLECYSTECTOMY    ? THROAT SURGERY    ? vocal cord polyp  ? ?Social History  ? ?Socioeconomic History  ? Marital status: Married  ?  Spouse name: Not on file  ? Number of children: 2  ? Years of education: Not on file  ? Highest education level: Not on file  ?Occupational History  ? Occupation: musician  ?Tobacco Use  ? Smoking status: Never  ? Smokeless tobacco: Never  ?Substance and Sexual Activity  ? Alcohol use: Yes  ?  Comment: social  ? Drug use: No  ? Sexual activity: Not on file  ?Other Topics Concern  ? Not on file  ?Social History Narrative  ? Separated from wife  ? Share custody 2 daughters  ? Original from [place]  ? ?Social Determinants of Health  ? ?Financial Resource Strain: Not on file  ?Food Insecurity: Not on file  ?Transportation Needs: Not on file  ?Physical Activity: Not on file  ?Stress: Not on file  ?Social Connections: Not on file  ?Intimate Partner Violence: Not on file  ? ? ? ?Current Outpatient Medications  ?Medication Instructions  ? ALPRAZolam (XANAX) 0.25 mg,  Oral, 2 times daily PRN  ? busPIRone (BUSPAR) 7.5 mg, Oral, 2 times daily  ? Colchicine 0.6 MG CAPS Oral  ? indomethacin (INDOCIN) 50 mg, Oral, 3 times daily PRN  ? ? ?   ?Objective:  ? Physical Exam ?BP 136/80 (BP Location: Left Arm, Patient Position: Sitting, Cuff Size: Normal)   Pulse 76   Temp 98.4 ?F (36.9 ?C) (Oral)   Resp 16   Ht 5\' 10"  (1.778 m)   Wt (!) 324 lb 4 oz (147.1 kg)   SpO2 96%   BMI 46.53 kg/m?  ?General: ?Well developed, NAD, BMI noted ?Neck: No  thyromegaly  ?HEENT:  ?Normocephalic . Face symmetric, atraumatic ?Lungs:  ?CTA B ?Normal respiratory effort, no intercostal retractions, no accessory muscle use. ?Heart: RRR,  no murmur.  ?Abdomen:  ?Not distended, soft, non-tender. No rebound or rigidity.   ?Lower extremities: no pretibial edema bilaterally  ?Skin: Exposed areas without rash. Not pale. Not jaundice ?Neurologic:  ?alert & oriented X3.  ?Speech normal, gait appropriate for age and unassisted ?Strength symmetric and appropriate for age.  ?Psych: ?Cognition and judgment appear intact.  ?Cooperative with normal attention span and concentration.  ?Behavior appropriate. ?No anxious or depressed appearing. ? ?   ?  Assessment   ? ?  Assessment  ?Anxiety, depression, insomnia --used to see Dr. Tomasa Rand, started to get prescriptions here 12-2014, on prozac/sonata prn; self d/c prozac 2017 ?H/o ED (d/t  Prozac) ?Gout: dx in the 90s, usually one episode a year ?Morbid obesity ?OSA, sleep study 09/2016 AHI 19.4/hour, RX CPAP ?COVID-19 infection 05/2020 ?Dizziness: Normal brain MRI 01/12/2020 (care everywhere). ? ?PLAN: ?Here for CPX ?Anxiety, depression, insomnia: ?See last visit, due to insurance issues did not start BuSpar, we will try again.  Xanax does help, take it regularly, UDS and contract today. ?Gout: Check uric acid, on Indocin as needed.  Episodic aches or pains at different parts of the lower extremity do not sound like gout episodes. ?Morbid obesity: Strongly recommend structured  diet.  Consequences of uncontrolled weight discussed. ?Dizziness: Since the last visit did not pursue neuro referral due to insurance.  He still has occasional symptoms.  Re-referral. ?RTC 3 months to follow-up on BuSpar ? ? ? ?This visit occurred during the SARS-CoV-2 public health emergency.  Safety protocols were in place, including screening questions prior to the visit, additional usage of staff PPE, and extensive cleaning of exam room while observing appropriate contact time as indicated for disinfecting solutions.  ? ?

## 2021-06-30 NOTE — Assessment & Plan Note (Signed)
Here for CPX ?Anxiety, depression, insomnia: ?See last visit, due to insurance issues did not start BuSpar, we will try again.  Xanax does help, take it regularly, UDS and contract today. ?Gout: Check uric acid, on Indocin as needed.  Episodic aches or pains at different parts of the lower extremity do not sound like gout episodes. ?Morbid obesity: Strongly recommend structured diet.  Consequences of uncontrolled weight discussed. ?Dizziness: Since the last visit did not pursue neuro referral due to insurance.  He still has occasional symptoms.  Re-referral. ?RTC 3 months to follow-up on BuSpar ?

## 2021-06-30 NOTE — Telephone Encounter (Signed)
PA initiated via Covermymeds; KEY :[id number]. Awaiting determination.  ?

## 2021-06-30 NOTE — Telephone Encounter (Signed)
PA cancelled by plan. Not covered drug under plan. No alternatives given.  ? ?PA Case: [id number], Status: Closed, Closed Reason Code: FM Product not covered by this plan. Prior Authorization not available., Closed Rationale: [employer] Prior Authorization team is unable to review this product for a coverage determination as the requested medication is not on the patient's formulary. Please reach out to [employer] directly for this request. Thank you in advance.. Questions? Contact SS:[id number]. ?

## 2021-07-01 LAB — HEPATITIS C ANTIBODY
Hepatitis C Ab: NONREACTIVE
SIGNAL TO CUT-OFF: 0.04 (ref <1.00)

## 2021-07-01 MED ORDER — DULOXETINE HCL 30 MG PO CPEP: 30.0000 mg | ORAL_CAPSULE | Freq: Every day | ORAL | 0 refills | Status: DC

## 2021-07-01 NOTE — Telephone Encounter (Signed)
Mychart message sent. Duloxetine sent to [facility]. Buspar d/c  ?

## 2021-07-01 NOTE — Telephone Encounter (Signed)
Chart reviewed: ?- Fluoxetine made him feel "odd", self stopped. ?- Unable to take Prozac due to erectile dysfunction. ?- Now buspar is not covered ?Advise patient: ?We will try Cymbalta 30 mg 1 tablet daily.  Send 68-month supply.  Let patient know. ?

## 2021-07-02 LAB — DRUG MONITORING PANEL 375977 , URINE

## 2021-07-02 LAB — DM TEMPLATE

## 2021-08-03 ENCOUNTER — Other Ambulatory Visit: Payer: Self-pay | Admitting: Internal Medicine

## 2021-08-03 DIAGNOSIS — M109 Gout, unspecified: Secondary | ICD-10-CM

## 2021-08-04 ENCOUNTER — Other Ambulatory Visit: Payer: Self-pay

## 2021-08-04 DIAGNOSIS — M109 Gout, unspecified: Secondary | ICD-10-CM

## 2021-08-04 MED ORDER — INDOMETHACIN 50 MG PO CAPS: ORAL_CAPSULE | ORAL | 0 refills | Status: DC

## 2021-08-07 ENCOUNTER — Encounter: Payer: Self-pay | Admitting: Internal Medicine

## 2021-09-29 ENCOUNTER — Ambulatory Visit: Payer: 59 | Admitting: Internal Medicine

## 2021-10-02 ENCOUNTER — Other Ambulatory Visit: Payer: Self-pay | Admitting: Internal Medicine

## 2021-10-07 ENCOUNTER — Ambulatory Visit: Payer: 59 | Admitting: Internal Medicine

## 2021-10-09 ENCOUNTER — Encounter: Payer: Self-pay | Admitting: Internal Medicine

## 2021-10-09 ENCOUNTER — Ambulatory Visit: Payer: 59 | Admitting: Internal Medicine

## 2021-10-10 ENCOUNTER — Telehealth: Payer: Self-pay

## 2021-10-10 NOTE — Telephone Encounter (Signed)
Pt called this morning- requesting to cancel his appt for today, 10/10/21 at 8:45am.     -Appt was actually yesterday, 10/09/21 at 8:40am.

## 2021-12-08 ENCOUNTER — Encounter: Payer: Self-pay | Admitting: Internal Medicine

## 2022-01-16 ENCOUNTER — Other Ambulatory Visit: Payer: Self-pay | Admitting: Internal Medicine

## 2022-01-16 MED ORDER — ALPRAZOLAM 0.5 MG PO TABS
0.2500 mg | ORAL_TABLET | Freq: Two times a day (BID) | ORAL | 0 refills | Status: DC | PRN
Start: 2022-01-16 — End: 2022-03-18

## 2022-01-16 MED ORDER — DULOXETINE HCL 30 MG PO CPEP: 30.0000 mg | ORAL_CAPSULE | Freq: Every day | ORAL | 0 refills | Status: DC

## 2022-01-16 NOTE — Telephone Encounter (Signed)
Pt switched ins and needs rx sent to new pharmacy.   Medication: ALPRAZolam (XANAX) 0.5 MG table  DULoxetine (CYMBALTA) 30 MG capsule  Has the patient contacted their pharmacy? No.   [facility] [address], [place], [place] [address]

## 2022-01-16 NOTE — Telephone Encounter (Signed)
Requesting: alprazolam 0.5mg   Contract:06/30/21 UDS:06/30/21 Last Visit: 06/30/21 Next Visit: 02/13/22 Last Refill: 02/25/21 #30 and 0RF   Please Advise

## 2022-02-13 ENCOUNTER — Encounter: Payer: Self-pay | Admitting: Internal Medicine

## 2022-02-13 ENCOUNTER — Ambulatory Visit: Payer: 59 | Admitting: Internal Medicine

## 2022-03-10 ENCOUNTER — Other Ambulatory Visit: Payer: Self-pay | Admitting: Family Medicine

## 2022-03-18 ENCOUNTER — Encounter: Payer: Self-pay | Admitting: Family Medicine

## 2022-03-18 ENCOUNTER — Ambulatory Visit: Payer: 59 | Admitting: Family Medicine

## 2022-03-18 VITALS — BP 138/86 | HR 83 | Temp 98.5°F | Ht 67.0 in | Wt 323.0 lb

## 2022-03-18 DIAGNOSIS — F419 Anxiety disorder, unspecified: Secondary | ICD-10-CM

## 2022-03-18 DIAGNOSIS — R69 Illness, unspecified: Secondary | ICD-10-CM | POA: Diagnosis not present

## 2022-03-18 MED ORDER — ALPRAZOLAM 0.5 MG PO TABS: 0.2500 mg | ORAL_TABLET | Freq: Two times a day (BID) | ORAL | 0 refills | Status: DC | PRN

## 2022-03-18 NOTE — Progress Notes (Signed)
Chief Complaint  Patient presents with   Medication Refill    Alprazolam    Subjective Tyler Doyle presents for f/u anxiety/depression.  Pt is currently being treated with Cymbalta 30 mg/d, Xanax 0.5 mg twice daily.  Reports having higher levels of anxiety since treatment. He notes that it is a stressful time of the year. He has slight erectile dysfunction with the Cymbalta which he also had with Prozac and Lexapro. The alprazolam is helpful. No thoughts of harming self or others. No self-medication with alcohol, prescription drugs or illicit drugs. Pt is not following with a counselor/psychologist.  Past Medical History:  Diagnosis Date   Anxiety and depression 05/13/2009   Qualifier: Diagnosis of  By: Drue Novel MD, Jose E.    Gout 05/13/2009   Qualifier: Diagnosis of  By: Drue Novel MD, Nolon Rod.    Allergies as of 03/18/2022       Reactions   Sulfa Antibiotics    Patient states " Felt as if I was going crazy"         Medication List        Accurate as of March 18, 2022  4:29 PM. If you have any questions, ask your nurse or doctor.          ALPRAZolam 0.5 MG tablet Commonly known as: Xanax Take 0.5 tablets (0.25 mg total) by mouth 2 (two) times daily as needed for anxiety.   Colchicine 0.6 MG Caps Take by mouth.   DULoxetine 30 MG capsule Commonly known as: CYMBALTA Take 1 capsule (30 mg total) by mouth daily.   indomethacin 50 MG capsule Commonly known as: INDOCIN TAKE 1 CAPSULE BY MOUTH 3 TIMES DAILY AS NEEDED FOR MODERATE PAIN. TAKE WITH FOOD, USE SPARINGLY        Exam BP 138/86 (BP Location: Left Arm, Patient Position: Sitting, Cuff Size: Large)   Pulse 83   Temp 98.5 F (36.9 C) (Oral)   Ht 5\' 7"  (1.702 m)   Wt (!) 323 lb (146.5 kg)   SpO2 93%   BMI 50.59 kg/m  General:  well developed, well nourished, in no apparent distress Lungs:  No respiratory distress Psych: well oriented with normal range of affect and age-appropriate judgement/insight,  alert and oriented x4.  Assessment and Plan  Anxiety  Chronic, unstable.  Patient does not wish to change medicine, will send a short supply of Xanax and, we will have him follow-up with regular PCP in 1 week.  He will research venlafaxine which I did bring up as a possible alternative to see if it has less erectile dysfunction associated with it.  Also discussed refilling sildenafil and increasing the Cymbalta dosage.  He politely declined any changes. The patient voiced understanding and agreement to the plan.  [place], DO 03/18/22 4:29 PM

## 2022-03-18 NOTE — Patient Instructions (Signed)
Stay on the current medicine.  A new medicine to consider would be Effexor/venlafaxine to replace the Cymbalta.   Let us know if you need anything.

## 2022-03-21 ENCOUNTER — Other Ambulatory Visit: Payer: Self-pay | Admitting: Internal Medicine

## 2022-04-15 ENCOUNTER — Telehealth: Payer: Self-pay | Admitting: Internal Medicine

## 2022-04-15 NOTE — Telephone Encounter (Signed)
Spoke to patient and explained he needed to see Dr. Larose Kells, appt made for 01/04.

## 2022-04-15 NOTE — Telephone Encounter (Signed)
Anxiety  Chronic, unstable.  Patient does not wish to change medicine, will send a short supply of Xanax and, we will have him follow-up with regular PCP in 1 week.  He will research venlafaxine which I did bring up as a possible alternative to see if it has less erectile dysfunction associated with it.  Also discussed refilling sildenafil and increasing the Cymbalta dosage.  He politely declined any changes. The patient voiced understanding and agreement to the plan.   Per Dr. Irene Limbo note above- he was to f/u w/ Dr. Larose Kells in 1 week.

## 2022-04-15 NOTE — Telephone Encounter (Signed)
Denied, needs F/U w/ Dr Larose Kells.

## 2022-04-15 NOTE — Telephone Encounter (Signed)
Prescription Request  04/15/2022  Is this a "Controlled Substance" medicine? Yes  LOV: Visit date not found  What is the name of the medication or equipment?  ALPRAZolam (XANAX) 0.5 MG tablet   Have you contacted your pharmacy to request a refill? No   Which pharmacy would you like this sent to?  [id number] [address], [place] [address] Phone: [phone] Fax: [phone]    Patient notified that their request is being sent to the clinical staff for review and that they should receive a response within 2 business days.   Please advise at [facility] [phone]

## 2022-04-15 NOTE — Telephone Encounter (Signed)
Patient was here on 03/18/22, does he need another med refill appt to get it approved? Or does he need to see Dr. Larose Kells only.

## 2022-04-16 ENCOUNTER — Encounter: Payer: Self-pay | Admitting: Internal Medicine

## 2022-04-16 ENCOUNTER — Ambulatory Visit: Payer: 59 | Admitting: Internal Medicine

## 2022-04-16 VITALS — BP 126/84 | HR 72 | Temp 97.7°F | Resp 18 | Ht 67.0 in | Wt 303.1 lb

## 2022-04-16 DIAGNOSIS — F419 Anxiety disorder, unspecified: Secondary | ICD-10-CM | POA: Diagnosis not present

## 2022-04-16 DIAGNOSIS — M109 Gout, unspecified: Secondary | ICD-10-CM

## 2022-04-16 DIAGNOSIS — R69 Illness, unspecified: Secondary | ICD-10-CM | POA: Diagnosis not present

## 2022-04-16 DIAGNOSIS — G4700 Insomnia, unspecified: Secondary | ICD-10-CM | POA: Diagnosis not present

## 2022-04-16 DIAGNOSIS — Z79899 Other long term (current) drug therapy: Secondary | ICD-10-CM

## 2022-04-16 DIAGNOSIS — F32A Depression, unspecified: Secondary | ICD-10-CM

## 2022-04-16 MED ORDER — ALPRAZOLAM 0.5 MG PO TABS: 0.2500 mg | ORAL_TABLET | Freq: Two times a day (BID) | ORAL | 0 refills | Status: DC | PRN

## 2022-04-16 NOTE — Progress Notes (Signed)
   Subjective:    Patient ID: Tyler Doyle, male    DOB: Sep 15, 1975, 47 y.o.   MRN: [id number]  DOS:  04/16/2022 Type of visit - description: f/u  Follow-up on chronic medical problems. Still has a lot of his stress, occasional panic attacks.  Denies depression per se.  Occasional insomnia.  Wt Readings from Last 3 Encounters:  04/16/22 (!) 303 lb 2 oz (137.5 kg)  03/18/22 (!) 323 lb (146.5 kg)  06/30/21 (!) 324 lb 4 oz (147.1 kg)    Review of Systems See above   Past Medical History:  Diagnosis Date   Anxiety and depression 05/13/2009   Qualifier: Diagnosis of  By: Larose Kells MD, [place]    Gout 05/13/2009   Qualifier: Diagnosis of  By: Larose Kells MD, [place]     Past Surgical History:  Procedure Laterality Date   APPENDECTOMY     CHOLECYSTECTOMY     THROAT SURGERY     vocal cord polyp    Current Outpatient Medications  Medication Instructions   ALPRAZolam (XANAX) 0.25 mg, Oral, 2 times daily PRN   Colchicine 0.6 MG CAPS Oral   DULoxetine (CYMBALTA) 30 MG capsule Oral, Daily   indomethacin (INDOCIN) 50 MG capsule TAKE 1 CAPSULE BY MOUTH 3 TIMES DAILY AS NEEDED FOR MODERATE PAIN. TAKE WITH FOOD, USE SPARINGLY       Objective:   Physical Exam BP 126/84   Pulse 72   Temp 97.7 F (36.5 C) (Oral)   Resp 18   Ht 5\' 7"  (1.702 m)   Wt (!) 303 lb 2 oz (137.5 kg)   SpO2 97%   BMI 47.48 kg/m  General:   Well developed, NAD, BMI noted. HEENT:  Normocephalic . Face symmetric, atraumatic Neurologic:  alert & oriented X3.  Speech normal, gait appropriate for age and unassisted Psych--  Cognition and judgment appear intact.  Cooperative with normal attention span and concentration.  Behavior appropriate. No anxious or depressed appearing.      Assessment     Assessment  Anxiety, depression, insomnia --used to see Dr. Candis Schatz, started to get prescriptions here 12-2014, on prozac/sonata prn; self d/c prozac 2017 H/o ED (d/t  Prozac) Gout: dx in the 90s, usually one episode  a year Morbid obesity OSA, sleep study 09/2016 AHI 19.4/hour, RX CPAP COVID-19 infection 05/2020 Dizziness: Normal brain MRI 01/12/2020 (care everywhere).  PLAN: Anxiety, depression, insomnia PHQ 9: 4, GAD 7: 4. Since the last visit, BuSpar was not covered by insurance,thus currently on Cymbalta 30 mg and Xanax as needed. Panic attacks, typically related to public speaking.  Denies depression per se. Overall I think he is doing well, we agreed not to increase dose of Cymbalta, RF Xanax (PDMP okay). I encouraged him to seek counseling, I think that will help managing his anxiety related to public speaking. Gout: Last uric acid slightly elevated, gout has essentially not been an issue since the last visit. Morbid obesity: + Weight loss noted, doing a keto diet, praised, encouraged to continue. ED: Still has occasional issues, likely multifactorial, reassess periodically. Vaccine advice provided.  See AVS RTC 4 months

## 2022-04-16 NOTE — Patient Instructions (Addendum)
Vaccines I recommend:  Covid booster Flu shot       GO TO THE FRONT DESK, PLEASE SCHEDULE YOUR APPOINTMENTS Come back for  a check up in 4 months

## 2022-04-16 NOTE — Assessment & Plan Note (Signed)
Anxiety, depression, insomnia PHQ 9: 4, GAD 7: 4. Since the last visit, BuSpar was not covered by insurance,thus currently on Cymbalta 30 mg and Xanax as needed. Panic attacks, typically related to public speaking.  Denies depression per se. Overall I think he is doing well, we agreed not to increase dose of Cymbalta, RF Xanax (PDMP okay). I encouraged him to seek counseling, I think that will help managing his anxiety related to public speaking. Gout: Last uric acid slightly elevated, gout has essentially not been an issue since the last visit. Morbid obesity: + Weight loss noted, doing a keto diet, praised, encouraged to continue. ED: Still has occasional issues, likely multifactorial, reassess periodically. Vaccine advice provided.  See AVS RTC 4 months

## 2022-06-15 ENCOUNTER — Telehealth: Payer: Self-pay | Admitting: Internal Medicine

## 2022-06-15 DIAGNOSIS — M109 Gout, unspecified: Secondary | ICD-10-CM

## 2022-06-15 MED ORDER — INDOMETHACIN 50 MG PO CAPS: ORAL_CAPSULE | ORAL | 0 refills | Status: DC

## 2022-06-15 NOTE — Telephone Encounter (Signed)
Medication: indomethacin (INDOCIN) 50 MG capsule  Has the patient contacted their pharmacy? No.   Preferred [facility]  [id number] [place] [place] [address] [id number]: [phone] Fax: [phone]

## 2022-06-15 NOTE — Telephone Encounter (Signed)
Rx sent 

## 2022-07-06 ENCOUNTER — Encounter: Payer: Self-pay | Admitting: Internal Medicine

## 2022-07-12 ENCOUNTER — Other Ambulatory Visit: Payer: Self-pay | Admitting: Internal Medicine

## 2022-07-12 DIAGNOSIS — M109 Gout, unspecified: Secondary | ICD-10-CM

## 2022-07-13 ENCOUNTER — Other Ambulatory Visit: Payer: Self-pay | Admitting: Internal Medicine

## 2022-07-13 DIAGNOSIS — M109 Gout, unspecified: Secondary | ICD-10-CM

## 2022-07-15 ENCOUNTER — Other Ambulatory Visit (HOSPITAL_COMMUNITY): Payer: Self-pay

## 2022-07-15 NOTE — Telephone Encounter (Signed)
Per test claim, Indomethacin is a product/service exclusion (meaning a PA won't affect coverage), this is the preferred medication according to insurance and goes through without PA.

## 2023-02-23 ENCOUNTER — Telehealth: Payer: Self-pay | Admitting: Internal Medicine

## 2023-02-23 NOTE — Telephone Encounter (Signed)
Pt scheduled  

## 2023-02-23 NOTE — Telephone Encounter (Signed)
Medication: ALPRAZolam (XANAX) 0.5 MG tablet  Has the patient contacted their pharmacy? No.   Preferred Pharmacy: . [id number] [place], [place] - [address] [place] [address] Phone: [phone]  Fax: [phone]

## 2023-02-23 NOTE — Telephone Encounter (Signed)
Last OV in 04/2022, was supposed to f/u in 4 months. Will need OV for refills.

## 2023-02-24 ENCOUNTER — Ambulatory Visit: Payer: 59 | Admitting: Internal Medicine

## 2023-03-01 ENCOUNTER — Encounter: Payer: Self-pay | Admitting: Internal Medicine

## 2023-03-01 ENCOUNTER — Ambulatory Visit: Payer: 59 | Admitting: Internal Medicine

## 2023-03-01 DIAGNOSIS — G4700 Insomnia, unspecified: Secondary | ICD-10-CM | POA: Diagnosis not present

## 2023-03-01 DIAGNOSIS — F32A Depression, unspecified: Secondary | ICD-10-CM

## 2023-03-01 DIAGNOSIS — Z79899 Other long term (current) drug therapy: Secondary | ICD-10-CM

## 2023-03-01 DIAGNOSIS — Z6841 Body Mass Index (BMI) 40.0 and over, adult: Secondary | ICD-10-CM | POA: Diagnosis not present

## 2023-03-01 DIAGNOSIS — F419 Anxiety disorder, unspecified: Secondary | ICD-10-CM

## 2023-03-01 LAB — HEMOGLOBIN A1C: Hgb A1c MFr Bld: 4.9 % (ref 4.6–6.5)

## 2023-03-01 MED ORDER — DULOXETINE HCL 30 MG PO CPEP: 30.0000 mg | ORAL_CAPSULE | Freq: Every day | ORAL | 0 refills | Status: DC

## 2023-03-01 MED ORDER — ALPRAZOLAM 0.5 MG PO TABS: 0.2500 mg | ORAL_TABLET | Freq: Two times a day (BID) | ORAL | 0 refills | Status: DC | PRN

## 2023-03-01 NOTE — Progress Notes (Unsigned)
   Subjective:    Patient ID: Tyler Doyle, male    DOB: 02-Apr-1976, 47 y.o.   MRN: [id number]  DOS:  03/01/2023 Type of visit - description: Follow-up  Here for refill of medications. Had a very stressful year, had a panic attack recently. Gout: No recent problems. Morbid obesity: Not making progress.  Wt Readings from Last 3 Encounters:  03/01/23 (!) 338 lb 8 oz (153.5 kg)  04/16/22 (!) 303 lb 2 oz (137.5 kg)  03/18/22 (!) 323 lb (146.5 kg)    Review of Systems See above   Past Medical History:  Diagnosis Date   Anxiety and depression 05/13/2009   Qualifier: Diagnosis of  By: Drue Novel MD, Nolon Rod.    Gout 05/13/2009   Qualifier: Diagnosis of  By: Drue Novel MD, Nolon Rod.     Past Surgical History:  Procedure Laterality Date   APPENDECTOMY     CHOLECYSTECTOMY     THROAT SURGERY     vocal cord polyp    Current Outpatient Medications  Medication Instructions   ALPRAZolam (XANAX) 0.25 mg, Oral, 2 times daily PRN   Colchicine 0.6 MG CAPS Take by mouth.   DULoxetine (CYMBALTA) 30 mg, Oral, Daily   indomethacin (INDOCIN) 50 mg, Oral, 3 times daily PRN, Use sparingly       Objective:   Physical Exam BP 132/86   Pulse 66   Temp 98 F (36.7 C) (Oral)   Resp 18   Ht 5\' 7"  (1.702 m)   Wt (!) 338 lb 8 oz (153.5 kg)   SpO2 94%   BMI 53.02 kg/m  General:   Well developed, NAD, BMI noted. HEENT:  Normocephalic . Face symmetric, atraumatic Lungs:  CTA B Normal respiratory effort, no intercostal retractions, no accessory muscle use. Heart: RRR,  no murmur.  Lower extremities: no pretibial edema bilaterally  Skin: Not pale. Not jaundice Neurologic:  alert & oriented X3.  Speech normal, gait appropriate for age and unassisted Psych--  Cognition and judgment appear intact.  Cooperative with normal attention span and concentration.  Behavior appropriate. No anxious or depressed appearing.      Assessment    Assessment  Anxiety, depression, insomnia --used to see Dr.  Tomasa Rand, started to get prescriptions here 12-2014, on prozac/sonata prn; self d/c prozac 2017 H/o ED (d/t  Prozac) Gout: dx in the 90s, usually one episode a year Morbid obesity OSA, sleep study 09/2016 AHI 19.4/hour, RX CPAP Dizziness: Normal brain MRI 01/12/2020 (care everywhere).  PLAN: Anxiety, depression insomnia: Ran out of Cymbalta several weeks ago, requesting Xanax refill. Reports his year has been very stressful, again having panic attacks with public speaking. Denies depression per se. Plan: PDMP okay, refill Xanax, refill Cymbalta. Morbid obesity: Will start working with health coach, I think is a great idea, I strongly encourage healthy diet. He inquired about medications for weight loss, plan: CMP, A1c, recommend to come back in 1 month for CPX and weight management. RTC 1 month

## 2023-03-01 NOTE — Patient Instructions (Addendum)
Vaccines I recommend: Covid booster Flu shot     GO TO THE LAB : Get the blood work     Next visit with me in 1 month for your physical exam.  Will also talk about weight management    Please schedule it at the front desk

## 2023-03-02 LAB — COMPREHENSIVE METABOLIC PANEL
ALT: 30 U/L (ref 0–53)
AST: 34 U/L (ref 0–37)
Albumin: 4.2 g/dL (ref 3.5–5.2)
Alkaline Phosphatase: 93 U/L (ref 39–117)
BUN: 10 mg/dL (ref 6–23)
CO2: 27 meq/L (ref 19–32)
Calcium: 9.3 mg/dL (ref 8.4–10.5)
Chloride: 107 meq/L (ref 96–112)
Creatinine, Ser: 0.98 mg/dL (ref 0.40–1.50)
GFR: 90.71 mL/min (ref >60.00)
Glucose, Bld: 103 mg/dL — ABNORMAL HIGH (ref 70–99)
Potassium: 4.1 meq/L (ref 3.5–5.1)
Sodium: 142 meq/L (ref 135–145)
Total Bilirubin: 1.4 mg/dL — ABNORMAL HIGH (ref 0.2–1.2)
Total Protein: 7 g/dL (ref 6.0–8.3)

## 2023-03-02 NOTE — Assessment & Plan Note (Signed)
Anxiety, depression insomnia: Ran out of Cymbalta several weeks ago, requesting Xanax refill. Reports his year has been very stressful, again having panic attacks with public speaking. Denies depression per se. Plan: PDMP okay, refill Xanax, refill Cymbalta. Morbid obesity: Will start working with health coach, I think is a great idea, I strongly encourage healthy diet. He inquired about medications for weight loss, plan: CMP, A1c, recommend to come back in 1 month for CPX and weight management. RTC 1 month

## 2023-03-03 LAB — DRUG MONITORING PANEL 375977 , URINE

## 2023-03-03 LAB — DM TEMPLATE

## 2023-03-04 ENCOUNTER — Other Ambulatory Visit: Payer: Self-pay

## 2023-03-04 DIAGNOSIS — Z1211 Encounter for screening for malignant neoplasm of colon: Secondary | ICD-10-CM

## 2023-03-05 ENCOUNTER — Other Ambulatory Visit: Payer: Self-pay | Admitting: Internal Medicine

## 2023-04-30 ENCOUNTER — Telehealth: Payer: Self-pay

## 2023-04-30 ENCOUNTER — Encounter: Payer: 59 | Admitting: Internal Medicine

## 2023-04-30 NOTE — Telephone Encounter (Signed)
Multiple no shows and late cancellations  10/09/21 02/13/22 02/24/23 04/30/23  Would you like to begin dismissal process?

## 2023-05-03 NOTE — Telephone Encounter (Signed)
Okay to start dismissal if he has 3 no-shows

## 2023-05-04 NOTE — Telephone Encounter (Signed)
Pt does not have 3 no shows, however has 1 no show and 3 late cancellations within 24 hours- are we able to dismiss?

## 2023-08-27 ENCOUNTER — Telehealth: Payer: Self-pay | Admitting: Internal Medicine

## 2023-08-27 DIAGNOSIS — M109 Gout, unspecified: Secondary | ICD-10-CM

## 2023-08-27 NOTE — Telephone Encounter (Signed)
 Sent #30 no refills

## 2023-10-25 ENCOUNTER — Telehealth: Payer: Self-pay | Admitting: Internal Medicine

## 2023-10-25 NOTE — Telephone Encounter (Signed)
 Copied from CRM [id number]. Topic: Clinical - Medication Refill >> Oct 25, 2023 10:05 AM [place] MATSU wrote: Medication:  ALPRAZolam  (XANAX ) 0.5 MG tablet; patient is requesting a refill asap as he will be traveling tomorrow.   Has the patient contacted their pharmacy? Yes (Agent: If no, request that the patient contact the pharmacy for the refill. If patient does not wish to contact the pharmacy document the reason why and proceed with request.) (Agent: If yes, when and what did the pharmacy advise?)  This is the patient's preferred pharmacy:  [id number] [place], [place] - [address] [place] [address] Phone: [phone] Fax: [phone]  Is this the correct pharmacy for this prescription? Yes If no, delete pharmacy and type the correct one.   Has the prescription been filled recently? Yes  Is the patient out of the medication? Yes  Has the patient been seen for an appointment in the last year OR does the patient have an upcoming appointment? Yes  Can we respond through MyChart? Yes  Agent: Please be advised that Rx refills may take up to 3 business days. We ask that you follow-up with your pharmacy.

## 2023-10-25 NOTE — Telephone Encounter (Signed)
 03/01/23 40 tabs/0 RF

## 2023-10-26 NOTE — Telephone Encounter (Signed)
 Copied from CRM [id number]. Topic: Clinical - Prescription Issue >> Oct 25, 2023  5:00 PM Tiffini S wrote: Reason for CRM: Patient called to inquiry about medication refill for ALPRAZolam  (XANAX ) 0.5 MG tablet/ asked for prescription to be sent today as he will be traveling tomorrow and will need the medication >> Oct 25, 2023  5:15 PM Mia F wrote: Pt called to follow up on rx request. He said he was told that it was sent and then the call was disconnected. Pt was advised to follow up with pharmacy

## 2023-10-26 NOTE — Telephone Encounter (Signed)
 Multiple no shows since last visit. Will need appt for refills.

## 2023-11-08 ENCOUNTER — Other Ambulatory Visit: Payer: Self-pay | Admitting: Internal Medicine

## 2023-11-08 DIAGNOSIS — M109 Gout, unspecified: Secondary | ICD-10-CM

## 2023-11-08 MED ORDER — INDOMETHACIN 50 MG PO CAPS: 50.0000 mg | ORAL_CAPSULE | Freq: Three times a day (TID) | ORAL | 0 refills | Status: DC | PRN

## 2023-11-08 NOTE — Telephone Encounter (Signed)
 Copied from CRM [id number]. Topic: Clinical - Medication Refill >> Nov 08, 2023 11:38 AM Rea C wrote: Medication: ALPRAZolam  (XANAX ) 0.5 MG tablet indomethacin  (INDOCIN ) 50 MG capsule  Has the patient contacted their pharmacy? Yes, Pharmacy reached out to patient and stated that he needs an appointment. Patient is scheduled for an appointment Monday August 4th @ 1:00pm. Patient is concerned about going a full week without medications. He is in a lot of pain and dealing with gout.   This is the patient's preferred pharmacy:  [id number] [place], [place] - [address] [place] [address] Phone: [phone] Fax: [phone]  Is this the correct pharmacy for this prescription? Yes If no, delete pharmacy and type the correct one.   Has the prescription been filled recently? Yes  Is the patient out of the medication? Yes  Has the patient been seen for an appointment in the last year OR does the patient have an upcoming appointment? Yes, Monday August 4th @ 1:00pm.   Can we respond through MyChart? Yes   Agent: Please be advised that Rx refills may take up to 3 business days. We ask that you follow-up with your pharmacy.

## 2023-11-08 NOTE — Telephone Encounter (Signed)
 Short term supply for indocin  sent, will have to wait for visit w/ PCP for alprazolam .

## 2023-11-08 NOTE — Telephone Encounter (Signed)
 Copied from CRM [id number]. Topic: Clinical - Medication Refill >> Nov 08, 2023 10:00 AM Carlyon D wrote: Medication: Indomethacin  50 MG   Has the patient contacted their pharmacy? Yes (Agent: If no, request that the patient contact the pharmacy for the refill. If patient does not wish to contact the pharmacy document the reason why and proceed with request.) (Agent: If yes, when and what did the pharmacy advise?)  This is the patient's preferred pharmacy:  [id number] [place], [place] - [address] [place]  [address] Phone: [phone] Fax: [phone]  Is this the correct pharmacy for this prescription? Yes If no, delete pharmacy and type the correct one.   Has the prescription been filled recently? Yes  Is the patient out of the medication? Yes  Has the patient been seen for an appointment in the last year OR does the patient have an upcoming appointment? Yes  Can we respond through MyChart? Yes  Agent: Please be advised that Rx refills may take up to 3 business days. We ask that you follow-up with your pharmacy.

## 2023-11-15 ENCOUNTER — Ambulatory Visit (INDEPENDENT_AMBULATORY_CARE_PROVIDER_SITE_OTHER): Payer: Self-pay | Admitting: Internal Medicine

## 2023-11-15 ENCOUNTER — Encounter: Payer: Self-pay | Admitting: Internal Medicine

## 2023-11-15 VITALS — BP 136/80 | HR 69 | Temp 98.3°F | Resp 18 | Ht 67.0 in | Wt 322.0 lb

## 2023-11-15 DIAGNOSIS — N529 Male erectile dysfunction, unspecified: Secondary | ICD-10-CM | POA: Diagnosis not present

## 2023-11-15 DIAGNOSIS — G4700 Insomnia, unspecified: Secondary | ICD-10-CM | POA: Diagnosis not present

## 2023-11-15 DIAGNOSIS — G4733 Obstructive sleep apnea (adult) (pediatric): Secondary | ICD-10-CM

## 2023-11-15 DIAGNOSIS — F32A Depression, unspecified: Secondary | ICD-10-CM

## 2023-11-15 DIAGNOSIS — F419 Anxiety disorder, unspecified: Secondary | ICD-10-CM | POA: Diagnosis not present

## 2023-11-15 MED ORDER — KETOCONAZOLE 2 % EX CREA: 1.0000 | TOPICAL_CREAM | Freq: Every day | CUTANEOUS | 0 refills | Status: AC

## 2023-11-15 MED ORDER — SILDENAFIL CITRATE 20 MG PO TABS: 80.0000 mg | ORAL_TABLET | Freq: Every evening | ORAL | 3 refills | Status: AC | PRN

## 2023-11-15 MED ORDER — ALPRAZOLAM 0.5 MG PO TABS: 0.2500 mg | ORAL_TABLET | Freq: Two times a day (BID) | ORAL | 0 refills | Status: DC | PRN

## 2023-11-15 NOTE — Patient Instructions (Signed)
 Apply the cream twice a day for 1 week.  Restart the cream if the rash come backs.   Next office visit for a physical exam in about 3 to 4 months Please make an appointment before you leave today

## 2023-11-15 NOTE — Progress Notes (Signed)
   Subjective:    Patient ID: Tyler Doyle, male    DOB: [birth year], 48 y.o.   MRN: [id number]  DOS:  11/15/2023 Type of visit - description: Medication refill  On Xanax , working well for him. Request a refill for ED medication.  Has taken before without any problems. Obesity: Weight loss noted, doing better with diet.  Wt Readings from Last 3 Encounters:  11/15/23 (!) 322 lb (146.1 kg)  03/01/23 (!) 338 lb 8 oz (153.5 kg)  04/16/22 (!) 303 lb 2 oz (137.5 kg)   Review of Systems See above   Past Medical History:  Diagnosis Date   Anxiety and depression 05/13/2009   Qualifier: Diagnosis of  By: Amon MD, Aloysius BRAVO.    Gout 05/13/2009   Qualifier: Diagnosis of  By: Amon MD, Aloysius BRAVO.     Past Surgical History:  Procedure Laterality Date   APPENDECTOMY     CHOLECYSTECTOMY     THROAT SURGERY     vocal cord polyp    Current Outpatient Medications  Medication Instructions   ALPRAZolam  (XANAX ) 0.25 mg, Oral, 2 times daily PRN   Colchicine  0.6 MG CAPS Take by mouth.   DULoxetine  (CYMBALTA ) 30 mg, Oral, Daily   indomethacin  (INDOCIN ) 50 mg, Oral, 3 times daily PRN       Objective:   Physical Exam BP 136/80   Pulse 69   Temp 98.3 F (36.8 C) (Oral)   Resp 18   Ht 5' 7 (1.702 m)   Wt (!) 322 lb (146.1 kg)   SpO2 96%   BMI 50.43 kg/m  General:   Well developed, NAD, BMI noted. HEENT:  Normocephalic . Face symmetric, atraumatic Skin: Patches of scaly, slightly dry and raised skin on the feet. Neurologic:  alert & oriented X3.  Speech normal, gait appropriate for age and unassisted Psych--  Cognition and judgment appear intact.  Cooperative with normal attention span and concentration.  Behavior appropriate. No anxious or depressed appearing.      Assessment     Assessment  Anxiety, depression, insomnia --used to see Dr. Stacia, started to get prescriptions here 12-2014, on prozac /sonata  prn; self d/c prozac  2017 H/o ED (d/t  Prozac ) Gout: dx in the 90s,  usually one episode a year Morbid obesity OSA, sleep study 09/2016 AHI 19.4/hour, RX CPAP Dizziness: Normal brain MRI 01/12/2020 (care everywhere).  PLAN: Anxiety, depression, insomnia: Currently only on Xanax , take it sporadically, mostly when he has to perform and gets anxious and sometimes even dizzy. Stopped Cymbalta  due to increased appetite and decreased libido. PDMP okay, Xanax  prescription sent. Morbid obesity: Making progress, present, in addition to improve the diet he is going to start exercising. OSA: Reports 100% CPAP compliance Dermatitis: Occasionally has scaly itchy rash of the feet, previously responded to antifungal, requests a a RF, sent. ED: Has used sildenafil  before without side effects, Rx sent. RTC 3 to 4 months CPX

## 2023-11-15 NOTE — Assessment & Plan Note (Signed)
 Anxiety, depression, insomnia: Currently only on Xanax , take it sporadically, mostly when he has to perform and gets anxious and sometimes even dizzy. Stopped Cymbalta  due to increased appetite and decreased libido. PDMP okay, Xanax  prescription sent. Morbid obesity: Making progress, present, in addition to improve the diet he is going to start exercising. OSA: Reports 100% CPAP compliance Dermatitis: Occasionally has scaly itchy rash of the feet, previously responded to antifungal, requests a a RF, sent. ED: Has used sildenafil  before without side effects, Rx sent. RTC 3 to 4 months CPX

## 2023-11-17 ENCOUNTER — Telehealth: Payer: Self-pay

## 2023-11-17 ENCOUNTER — Other Ambulatory Visit (HOSPITAL_COMMUNITY): Payer: Self-pay

## 2023-11-17 NOTE — Telephone Encounter (Signed)
 Pharmacy Patient Advocate Encounter   Received notification from CoverMyMeds that prior authorization for Sildenafil  Citrate (PAH) 20MG  tablets is required/requested.   Insurance verification completed.   The patient is insured through [employer]  .   Per test claim: PA required; PA submitted to above mentioned insurance via CoverMyMeds Key/confirmation #/EOC [id number] Status is pending

## 2023-11-18 ENCOUNTER — Other Ambulatory Visit (HOSPITAL_COMMUNITY): Payer: Self-pay

## 2023-11-18 NOTE — Telephone Encounter (Signed)
 Pharmacy Patient Advocate Encounter  Received notification from [employer]  that Prior Authorization for Sildenafil  Citrate (PAH) 20MG  tablets has been DENIED.  Full denial letter will be uploaded to the media tab. See denial reason below.   PA #/Case ID/Reference #: [id number]    Denied. This drug is not covered because drugs used to treat sexual dysfunction are not covered by [employer] .

## 2023-12-30 ENCOUNTER — Other Ambulatory Visit: Payer: Self-pay | Admitting: Internal Medicine

## 2023-12-30 DIAGNOSIS — M109 Gout, unspecified: Secondary | ICD-10-CM

## 2024-04-12 ENCOUNTER — Other Ambulatory Visit: Payer: Self-pay | Admitting: Internal Medicine

## 2024-04-12 ENCOUNTER — Encounter: Payer: Self-pay | Admitting: Internal Medicine

## 2024-04-12 NOTE — Telephone Encounter (Signed)
 Copied from CRM #[id number]. Topic: Clinical - Medication Refill >> Apr 12, 2024 11:45 AM Tinnie C wrote: Medication: ALPRAZolam  (XANAX ) 0.5 MG tablet  Has the patient contacted their pharmacy? Yes They said they are waiting for an answer from us . No refill request seen in chart.  This is the patient's preferred pharmacy:  [id number] [place], [place] - [address] [place] [address] Phone: [phone] Fax: [phone]  Is this the correct pharmacy for this prescription? Yes If no, delete pharmacy and type the correct one.   Has the prescription been filled recently? Yes  Is the patient out of the medication? Yes, has been out 1 day  Has the patient been seen for an appointment in the last year OR does the patient have an upcoming appointment? Yes  Can we respond through MyChart? Please call [phone]  Agent: Please be advised that Rx refills may take up to 3 business days. We ask that you follow-up with your pharmacy.

## 2024-04-12 NOTE — Telephone Encounter (Signed)
 Requesting: Xanax  0.5 MG Contract: 04/16/2022 UDS: 03/01/2023 Last Visit: 11/15/2023 Next Visit: Visit date not found Last Refill: 11/15/2023  Please Advise

## 2024-04-17 MED ORDER — ALPRAZOLAM 0.5 MG PO TABS: 0.2500 mg | ORAL_TABLET | Freq: Two times a day (BID) | ORAL | 0 refills | Status: AC | PRN

## 2024-04-17 NOTE — Telephone Encounter (Signed)
"  Message sent   "

## 2024-04-17 NOTE — Telephone Encounter (Signed)
 Sent patient a message, due for a office visit. PDMP okay, prescription sent.

## 2024-05-15 ENCOUNTER — Ambulatory Visit: Admitting: Internal Medicine
